# Patient Record
Sex: Male | Born: 1935 | Race: White | Hispanic: No | Marital: Married | Smoking: Former smoker
Health system: Southern US, Community
[De-identification: ages and names within clinical notes are randomized; demographics above are authoritative.]

## PROBLEM LIST (undated history)

## (undated) DIAGNOSIS — R350 Frequency of micturition: Secondary | ICD-10-CM

## (undated) DIAGNOSIS — N471 Phimosis: Secondary | ICD-10-CM

## (undated) DIAGNOSIS — Z973 Presence of spectacles and contact lenses: Secondary | ICD-10-CM

## (undated) DIAGNOSIS — Z8711 Personal history of peptic ulcer disease: Secondary | ICD-10-CM

## (undated) DIAGNOSIS — R3915 Urgency of urination: Secondary | ICD-10-CM

## (undated) DIAGNOSIS — Z972 Presence of dental prosthetic device (complete) (partial): Secondary | ICD-10-CM

## (undated) DIAGNOSIS — I10 Essential (primary) hypertension: Secondary | ICD-10-CM

---

## 1995-02-20 HISTORY — PX: GASTROTOMY: SHX61

## 2009-03-04 ENCOUNTER — Encounter: Admission: RE | Admit: 2009-03-04 | Discharge: 2009-03-04 | Payer: Self-pay | Admitting: Internal Medicine

## 2010-03-12 ENCOUNTER — Encounter: Payer: Self-pay | Admitting: Internal Medicine

## 2013-02-19 HISTORY — PX: CATARACT EXTRACTION W/ INTRAOCULAR LENS  IMPLANT, BILATERAL: SHX1307

## 2014-08-17 DIAGNOSIS — Z0100 Encounter for examination of eyes and vision without abnormal findings: Secondary | ICD-10-CM | POA: Diagnosis not present

## 2014-08-17 DIAGNOSIS — Z961 Presence of intraocular lens: Secondary | ICD-10-CM | POA: Diagnosis not present

## 2016-04-12 DIAGNOSIS — M545 Low back pain: Secondary | ICD-10-CM | POA: Diagnosis not present

## 2016-04-12 DIAGNOSIS — R3911 Hesitancy of micturition: Secondary | ICD-10-CM | POA: Diagnosis not present

## 2016-04-12 DIAGNOSIS — N481 Balanitis: Secondary | ICD-10-CM | POA: Diagnosis not present

## 2016-04-12 DIAGNOSIS — H9193 Unspecified hearing loss, bilateral: Secondary | ICD-10-CM | POA: Diagnosis not present

## 2016-04-12 DIAGNOSIS — Z1389 Encounter for screening for other disorder: Secondary | ICD-10-CM | POA: Diagnosis not present

## 2016-04-12 DIAGNOSIS — R350 Frequency of micturition: Secondary | ICD-10-CM | POA: Diagnosis not present

## 2016-04-12 DIAGNOSIS — Z6826 Body mass index (BMI) 26.0-26.9, adult: Secondary | ICD-10-CM | POA: Diagnosis not present

## 2016-04-13 DIAGNOSIS — R350 Frequency of micturition: Secondary | ICD-10-CM | POA: Diagnosis not present

## 2016-04-13 DIAGNOSIS — R3911 Hesitancy of micturition: Secondary | ICD-10-CM | POA: Diagnosis not present

## 2016-04-13 DIAGNOSIS — Z0000 Encounter for general adult medical examination without abnormal findings: Secondary | ICD-10-CM | POA: Diagnosis not present

## 2016-05-23 DIAGNOSIS — N471 Phimosis: Secondary | ICD-10-CM | POA: Diagnosis not present

## 2016-05-23 DIAGNOSIS — R350 Frequency of micturition: Secondary | ICD-10-CM | POA: Diagnosis not present

## 2016-05-23 DIAGNOSIS — R8279 Other abnormal findings on microbiological examination of urine: Secondary | ICD-10-CM | POA: Diagnosis not present

## 2016-05-24 ENCOUNTER — Other Ambulatory Visit: Payer: Self-pay | Admitting: Urology

## 2016-05-30 ENCOUNTER — Encounter (HOSPITAL_BASED_OUTPATIENT_CLINIC_OR_DEPARTMENT_OTHER): Payer: Self-pay | Admitting: *Deleted

## 2016-05-30 NOTE — Progress Notes (Signed)
NPO AFTER MN.  ARRIVE AT 0600.  NEEDS HG.

## 2016-06-03 NOTE — H&P (Signed)
HPI: Anthony Mclaughlin is a 83 year-old male patient with phimosis  The patient complains of lower urinary tract symptom(s) that include frequency and hesitancy. He has had the symptom(s) for 4 months. His symptoms did begin gradually. He gets up at night to urinate 1 time.   He said that he experiences frequency primarily when he is at home and when he takes a trip he can go for 6 or 7 hours without having to urinate. He does not drink any caffeinated beverages and does not drink an excessive amount of fluids when he is at home. He does not have significant nocturia.     CC: I have trouble rolling my foreskin back.  HPI: He has had difficulties with rolling his foreskin back for 4 months. He was last able to easily roll his foreskin back approximately 02/20/2016. His symptoms have gotten worse over the last year. He does not have diabetes.   He does not have dysuria. He has had inflammation of the head of his penis. He has had to use creams on the lesion.   He reports that for 3-4 months he has had difficulty retracting his foreskin. He also had some irritation and was treated with clotrimazole with resolution of the irritation. He's never had a UTI. He reports he does not have diabetes. His phimotic foreskin has resulted in retention of urine inside the foreskin that causes post void dribbling. This has resulted in his need to wear a pull-up diaper.     ALLERGIES: None   MEDICATIONS: Fish Oil  Multi Vitamin  Vitamin B-12 1,000 mcg tablet, extended release     GU PSH: None   NON-GU PSH: Remove Tonsils And Adenoids    GU PMH: None   NON-GU PMH: Acute gastric ulcer with hemorrhage    FAMILY HISTORY: 2 daughters - Other Alzheimer's Disease - Father, Brother Lung Cancer - Mother   SOCIAL HISTORY: Marital Status: Married Current Smoking Status: Patient has never smoked.   Tobacco Use Assessment Completed: Used Tobacco in last 30 days? Has never drank.  Drinks 3 caffeinated drinks per  day.    REVIEW OF SYSTEMS:    GU Review Male:   Patient reports frequent urination and trouble starting your stream. Patient denies hard to postpone urination, burning/ pain with urination, get up at night to urinate, leakage of urine, stream starts and stops, have to strain to urinate , erection problems, and penile pain.  Gastrointestinal (Upper):   Patient denies nausea, vomiting, and indigestion/ heartburn.  Gastrointestinal (Lower):   Patient denies diarrhea and constipation.  Constitutional:   Patient denies fever, night sweats, weight loss, and fatigue.  Skin:   Patient denies skin rash/ lesion and itching.  Eyes:   Patient denies blurred vision and double vision.  Ears/ Nose/ Throat:   Patient denies sore throat and sinus problems.  Hematologic/Lymphatic:   Patient denies swollen glands and easy bruising.  Cardiovascular:   Patient denies leg swelling and chest pains.  Respiratory:   Patient denies cough and shortness of breath.  Endocrine:   Patient denies excessive thirst.  Musculoskeletal:   Patient reports back pain. Patient denies joint pain.  Neurological:   Patient denies headaches and dizziness.  Psychologic:   Patient denies depression and anxiety.   VITAL SIGNS: None   GU PHYSICAL EXAMINATION:    Anus and Perineum: No hemorrhoids. No anal stenosis. No rectal fissure, no anal fissure. No edema, no dimple, no perineal tenderness, no anal tenderness.  Scrotum: No lesions.  No edema. No cysts. No warts.  Epididymides: Right: no spermatocele, no masses, no cysts, no tenderness, no induration, no enlargement. Left: no spermatocele, no masses, no cysts, no tenderness, no induration, no enlargement.  Testes: No tenderness, no swelling, no enlargement left testes. No tenderness, no swelling, no enlargement right testes. Normal location left testes. Normal location right testes. No mass, no cyst, no varicocele, no hydrocele left testes. No mass, no cyst, no varicocele, no hydrocele  right testes.  Urethral Meatus: Normal size. No lesion, no wart, no discharge, no polyp. Normal location.  Penis: Penis uncircumcised, phimosis. No foreskin warts, no cracks. No dorsal peyronie's plaques, no left corporal peyronie's plaques, no right corporal peyronie's plaques, no scarring, no shaft warts. No balanitis, no meatal stenosis.   Prostate: 40 gram or 2+ size. Left lobe normal consistency, right lobe normal consistency. Symmetrical lobes. No prostate nodule. Left lobe no tenderness, right lobe no tenderness.  Seminal Vesicles: Nonpalpable.  Sphincter Tone: Normal sphincter. No rectal tenderness. No rectal mass.    MULTI-SYSTEM PHYSICAL EXAMINATION:    Constitutional: Well-nourished. No physical deformities. Normally developed. Good grooming.  Neck: Neck symmetrical, not swollen. Normal tracheal position.  Respiratory: No labored breathing, no use of accessory muscles.   Cardiovascular: Normal temperature, normal extremity pulses, no swelling, no varicosities.  Lymphatic: No enlargement of neck, axillae, groin.  Skin: No paleness, no jaundice, no cyanosis. No lesion, no ulcer, no rash.  Neurologic / Psychiatric: Oriented to time, oriented to place, oriented to person. No depression, no anxiety, no agitation.  Gastrointestinal: No mass, no tenderness, no rigidity, non obese abdomen.  Eyes: Normal conjunctivae. Normal eyelids.  Ears, Nose, Mouth, and Throat: Left ear no scars, no lesions, no masses. Right ear no scars, no lesions, no masses. Nose no scars, no lesions, no masses. Normal hearing. Normal lips.  Musculoskeletal: Normal gait and station of head and neck.     PAST DATA REVIEWED:  Source Of History:  Patient  Records Review:   Previous Doctor Records, POC Tool  Notes:                     His PSA on 2/18 was normal at 0.74.   PROCEDURES:           PVR Ultrasound - [id number]  Notes: He is emptying his bladder adequately.  Scanned Volume: 000 cc         Urinalysis  w/Scope Dipstick Dipstick Cont'd Micro  Color: Yellow Bilirubin: Neg WBC/hpf: 10 - 20/hpf  Appearance: Clear Ketones: Neg RBC/hpf: 0 - 2/hpf  Specific Gravity: 1.025 Blood: Neg Bacteria: NS (Not Seen)  pH: 5.5 Protein: Neg Cystals: NS (Not Seen)  Glucose: Neg Urobilinogen: 0.2 Casts: NS (Not Seen)    Nitrites: Neg Trichomonas: Not Present    Leukocyte Esterase: 1+ Mucous: Present      Epithelial Cells: 0 - 5/hpf      Yeast: NS (Not Seen)      Sperm: Not Present    ASSESSMENT/PLAN:     ICD-10 Details  1 GU:   Phimosis - N47.1 He has a severe phimosis to the point where his foreskin cannot be retracted to even visualize the glans and it is causing retention of urine inside the foreskin which then causes dribbling afterwards necessitating the use of a protective undergarment. He has not been diagnosed with diabetes and had no glucose in his urine but I am going to check a BMP and hemoglobin A1c today. We then discussed the  options and because of the difficulty he has been having he wanted to consider pursuing circumcision. I therefore have discussed this procedure with him in detail including how the procedure is performed, its risks and complications, the probability of success, the outpatient nature of the procedure as well as the anticipated postoperative course. He understands and is elected to proceed.  2   Urinary Frequency - R35.0 His urinary frequency is limited to the mornings primarily and are enough of a bothered that he said he would like to try pharmacologic therapy so I'm going to let him take samples of Myrbetriq 25 mg since his PVR is 0 and he will let me know how this has worked for him.  3 NON-GU:   Other abnormal findings on microbiological examination of urine - R82.79 He did have some white cells in his urine today. I think this is almost certainly secondary to contamination because of his inability to retract his foreskin but because I am planning upcoming surgery and he has  having urinary frequency I cultured the urine which was found to be positive and therefore he was treated with appropriate antibiotic therapy.

## 2016-06-04 ENCOUNTER — Ambulatory Visit (HOSPITAL_BASED_OUTPATIENT_CLINIC_OR_DEPARTMENT_OTHER): Payer: Medicare Other | Admitting: Anesthesiology

## 2016-06-04 ENCOUNTER — Encounter (HOSPITAL_BASED_OUTPATIENT_CLINIC_OR_DEPARTMENT_OTHER): Payer: Self-pay | Admitting: *Deleted

## 2016-06-04 ENCOUNTER — Ambulatory Visit (HOSPITAL_BASED_OUTPATIENT_CLINIC_OR_DEPARTMENT_OTHER)
Admission: RE | Admit: 2016-06-04 | Discharge: 2016-06-04 | Disposition: A | Discharge status: Home / Self Care | Payer: Medicare Other | Source: Ambulatory Visit | Attending: Urology | Admitting: Urology

## 2016-06-04 ENCOUNTER — Encounter (HOSPITAL_BASED_OUTPATIENT_CLINIC_OR_DEPARTMENT_OTHER)
Admission: RE | Disposition: A | Discharge status: Home / Self Care | Payer: Self-pay | Source: Ambulatory Visit | Attending: Urology

## 2016-06-04 DIAGNOSIS — Z79899 Other long term (current) drug therapy: Secondary | ICD-10-CM | POA: Diagnosis not present

## 2016-06-04 DIAGNOSIS — Z87891 Personal history of nicotine dependence: Secondary | ICD-10-CM | POA: Insufficient documentation

## 2016-06-04 DIAGNOSIS — Q558 Other specified congenital malformations of male genital organs: Secondary | ICD-10-CM | POA: Insufficient documentation

## 2016-06-04 DIAGNOSIS — N471 Phimosis: Secondary | ICD-10-CM | POA: Diagnosis not present

## 2016-06-04 DIAGNOSIS — Z8711 Personal history of peptic ulcer disease: Secondary | ICD-10-CM | POA: Insufficient documentation

## 2016-06-04 DIAGNOSIS — N3943 Post-void dribbling: Secondary | ICD-10-CM | POA: Insufficient documentation

## 2016-06-04 DIAGNOSIS — N4889 Other specified disorders of penis: Secondary | ICD-10-CM | POA: Diagnosis not present

## 2016-06-04 DIAGNOSIS — IMO0001 Reserved for inherently not codable concepts without codable children: Secondary | ICD-10-CM

## 2016-06-04 HISTORY — DX: Presence of spectacles and contact lenses: Z97.3

## 2016-06-04 HISTORY — DX: Phimosis: N47.1

## 2016-06-04 HISTORY — DX: Frequency of micturition: R35.0

## 2016-06-04 HISTORY — PX: CIRCUMCISION: SHX1350

## 2016-06-04 HISTORY — DX: Urgency of urination: R39.15

## 2016-06-04 HISTORY — DX: Personal history of peptic ulcer disease: Z87.11

## 2016-06-04 HISTORY — DX: Presence of dental prosthetic device (complete) (partial): Z97.2

## 2016-06-04 LAB — POCT I-STAT, CHEM 8
BUN: 12 mg/dL (ref 6–20)
Calcium, Ion: 1.23 mmol/L (ref 1.15–1.40)
Chloride: 102 mmol/L (ref 101–111)
Creatinine, Ser: 1.4 mg/dL — ABNORMAL HIGH (ref 0.61–1.24)
Glucose, Bld: 111 mg/dL — ABNORMAL HIGH (ref 65–99)
HCT: 44 % (ref 39.0–52.0)
Hemoglobin: 15 g/dL (ref 13.0–17.0)
Potassium: 4.6 mmol/L (ref 3.5–5.1)
Sodium: 138 mmol/L (ref 135–145)
TCO2: 26 mmol/L (ref 0–100)

## 2016-06-04 SURGERY — CIRCUMCISION, ADULT
Anesthesia: General

## 2016-06-04 MED ORDER — DEXAMETHASONE SODIUM PHOSPHATE 10 MG/ML IJ SOLN
INTRAMUSCULAR | Status: AC
  Filled 2016-06-04: qty 1

## 2016-06-04 MED ORDER — PROPOFOL 10 MG/ML IV BOLUS
INTRAVENOUS | Status: AC
  Filled 2016-06-04: qty 20

## 2016-06-04 MED ORDER — EPHEDRINE 5 MG/ML INJ
INTRAVENOUS | Status: AC
  Filled 2016-06-04: qty 10

## 2016-06-04 MED ORDER — FENTANYL CITRATE (PF) 100 MCG/2ML IJ SOLN
INTRAMUSCULAR | Status: DC | PRN
  Administered 2016-06-04: 25 ug via INTRAVENOUS
  Administered 2016-06-04: 50 ug via INTRAVENOUS

## 2016-06-04 MED ORDER — FENTANYL CITRATE (PF) 100 MCG/2ML IJ SOLN
25.0000 ug | INTRAMUSCULAR | Status: DC | PRN
  Filled 2016-06-04: qty 1

## 2016-06-04 MED ORDER — PROPOFOL 500 MG/50ML IV EMUL
INTRAVENOUS | Status: DC | PRN
  Administered 2016-06-04: 50 mL via INTRAVENOUS
  Administered 2016-06-04: 150 mL via INTRAVENOUS

## 2016-06-04 MED ORDER — ONDANSETRON HCL 4 MG/2ML IJ SOLN
INTRAMUSCULAR | Status: DC | PRN
  Administered 2016-06-04: 4 mg via INTRAVENOUS

## 2016-06-04 MED ORDER — GLYCOPYRROLATE 0.2 MG/ML IJ SOLN
INTRAMUSCULAR | Status: DC | PRN
  Administered 2016-06-04: 0.2 mg via INTRAVENOUS

## 2016-06-04 MED ORDER — EPHEDRINE SULFATE 50 MG/ML IJ SOLN
INTRAMUSCULAR | Status: DC | PRN
  Administered 2016-06-04: 15 mg via INTRAVENOUS

## 2016-06-04 MED ORDER — CEFAZOLIN SODIUM-DEXTROSE 2-4 GM/100ML-% IV SOLN
2.0000 g | INTRAVENOUS | Status: AC
  Administered 2016-06-04: 2 g via INTRAVENOUS
  Filled 2016-06-04: qty 100

## 2016-06-04 MED ORDER — BUPIVACAINE HCL 0.5 % IJ SOLN
INTRAMUSCULAR | Status: DC | PRN
  Administered 2016-06-04: 10 mL
  Administered 2016-06-04: 8 mL

## 2016-06-04 MED ORDER — CEFAZOLIN SODIUM-DEXTROSE 2-4 GM/100ML-% IV SOLN
INTRAVENOUS | Status: AC
  Filled 2016-06-04: qty 100

## 2016-06-04 MED ORDER — FENTANYL CITRATE (PF) 100 MCG/2ML IJ SOLN
INTRAMUSCULAR | Status: AC
  Filled 2016-06-04: qty 2

## 2016-06-04 MED ORDER — ARTIFICIAL TEARS OP OINT
TOPICAL_OINTMENT | OPHTHALMIC | Status: AC
  Filled 2016-06-04: qty 3.5

## 2016-06-04 MED ORDER — ONDANSETRON HCL 4 MG/2ML IJ SOLN
INTRAMUSCULAR | Status: AC
  Filled 2016-06-04: qty 2

## 2016-06-04 MED ORDER — HYDROCODONE-ACETAMINOPHEN 10-325 MG PO TABS: 1.0000 | ORAL_TABLET | ORAL | 0 refills | Status: DC | PRN

## 2016-06-04 MED ORDER — LIDOCAINE 2% (20 MG/ML) 5 ML SYRINGE
INTRAMUSCULAR | Status: DC | PRN
  Administered 2016-06-04: 100 mg via INTRAVENOUS

## 2016-06-04 MED ORDER — PHENYLEPHRINE 40 MCG/ML (10ML) SYRINGE FOR IV PUSH (FOR BLOOD PRESSURE SUPPORT)
PREFILLED_SYRINGE | INTRAVENOUS | Status: AC
  Filled 2016-06-04: qty 10

## 2016-06-04 MED ORDER — LACTATED RINGERS IV SOLN
INTRAVENOUS | Status: DC
  Administered 2016-06-04: 07:00:00 via INTRAVENOUS
  Filled 2016-06-04: qty 1000

## 2016-06-04 MED ORDER — LIDOCAINE 2% (20 MG/ML) 5 ML SYRINGE
INTRAMUSCULAR | Status: AC
  Filled 2016-06-04: qty 5

## 2016-06-04 MED ORDER — ONDANSETRON HCL 4 MG/2ML IJ SOLN
4.0000 mg | Freq: Once | INTRAMUSCULAR | Status: DC | PRN
  Filled 2016-06-04: qty 2

## 2016-06-04 MED ORDER — DEXAMETHASONE SODIUM PHOSPHATE 10 MG/ML IJ SOLN
INTRAMUSCULAR | Status: DC | PRN
  Administered 2016-06-04: 10 mg via INTRAVENOUS

## 2016-06-04 MED ORDER — BACITRACIN-NEOMYCIN-POLYMYXIN 400-5-5000 EX OINT
TOPICAL_OINTMENT | CUTANEOUS | Status: DC | PRN
  Administered 2016-06-04: 1 via TOPICAL

## 2016-06-04 SURGICAL SUPPLY — 36 items
BANDAGE CO FLEX L/F 1IN X 5YD (GAUZE/BANDAGES/DRESSINGS) ×3 IMPLANT
BANDAGE CO FLEX L/F 2IN X 5YD (GAUZE/BANDAGES/DRESSINGS) IMPLANT
BLADE CLIPPER SURG (BLADE) IMPLANT
BLADE SURG 15 STRL LF DISP TIS (BLADE) ×1 IMPLANT
BLADE SURG 15 STRL SS (BLADE) ×2
BNDG CONFORM 2 STRL LF (GAUZE/BANDAGES/DRESSINGS) ×3 IMPLANT
CLEANER CAUTERY TIP 5X5 PAD (MISCELLANEOUS) ×1 IMPLANT
COVER BACK TABLE 60X90IN (DRAPES) ×3 IMPLANT
COVER MAYO STAND STRL (DRAPES) ×3 IMPLANT
DRAIN PENROSE 18X1/4 LTX STRL (WOUND CARE) IMPLANT
DRAPE LAPAROTOMY 100X72 PEDS (DRAPES) ×3 IMPLANT
ELECT REM PT RETURN 9FT ADLT (ELECTROSURGICAL) ×3
ELECTRODE REM PT RTRN 9FT ADLT (ELECTROSURGICAL) ×1 IMPLANT
GLOVE BIO SURGEON STRL SZ8 (GLOVE) ×3 IMPLANT
GOWN STRL REUS W/ TWL LRG LVL3 (GOWN DISPOSABLE) ×1 IMPLANT
GOWN STRL REUS W/ TWL XL LVL3 (GOWN DISPOSABLE) ×1 IMPLANT
GOWN STRL REUS W/TWL LRG LVL3 (GOWN DISPOSABLE) ×2
GOWN STRL REUS W/TWL XL LVL3 (GOWN DISPOSABLE) ×2
IV NS IRRIG 3000ML ARTHROMATIC (IV SOLUTION) IMPLANT
KIT RM TURNOVER CYSTO AR (KITS) ×3 IMPLANT
MANIFOLD NEPTUNE II (INSTRUMENTS) IMPLANT
NEEDLE HYPO 22GX1.5 SAFETY (NEEDLE) ×3 IMPLANT
NS IRRIG 500ML POUR BTL (IV SOLUTION) IMPLANT
PACK BASIN DAY SURGERY FS (CUSTOM PROCEDURE TRAY) ×3 IMPLANT
PAD CLEANER CAUTERY TIP 5X5 (MISCELLANEOUS) ×2
PENCIL BUTTON HOLSTER BLD 10FT (ELECTRODE) ×3 IMPLANT
SUT CHROMIC 3 0 SH 27 (SUTURE) ×6 IMPLANT
SUT CHROMIC 4 0 RB 1X27 (SUTURE) IMPLANT
SUT CHROMIC 5 0 RB 1 27 (SUTURE) ×3 IMPLANT
SYR CONTROL 10ML LL (SYRINGE) ×3 IMPLANT
TOWEL OR 17X24 6PK STRL BLUE (TOWEL DISPOSABLE) ×6 IMPLANT
TRAY DSU PREP LF (CUSTOM PROCEDURE TRAY) ×3 IMPLANT
TUBE CONNECTING 12'X1/4 (SUCTIONS)
TUBE CONNECTING 12X1/4 (SUCTIONS) IMPLANT
WATER STERILE IRR 3000ML UROMA (IV SOLUTION) IMPLANT
WATER STERILE IRR 500ML POUR (IV SOLUTION) ×3 IMPLANT

## 2016-06-04 NOTE — Transfer of Care (Signed)
Immediate Anesthesia Transfer of Care Note  Patient: Wynelle Bourgeois  Procedure(s) Performed: Procedure(s): CIRCUMCISION ADULT (N/A)  Patient Location: PACU  Anesthesia Type:General  Level of Consciousness: awake, alert , oriented and patient cooperative  Airway & Oxygen Therapy: Patient Spontanous Breathing and Patient connected to nasal cannula oxygen  Post-op Assessment: Report given to RN and Post -op Vital signs reviewed and stable  Post vital signs: Reviewed and stable  Last Vitals:  Vitals:   06/04/16 0607  BP: (!) 159/92  Pulse: 99  Resp: 16  Temp: 37.1 C    Last Pain:  Vitals:   06/04/16 0607  TempSrc: Oral      Patients Stated Pain Goal: 5 (83/38/25 0539)  Complications: No apparent anesthesia complications

## 2016-06-04 NOTE — Anesthesia Preprocedure Evaluation (Addendum)
Anesthesia Evaluation  Patient identified by MRN, date of birth, ID band Patient awake    Reviewed: Allergy & Precautions, NPO status , Patient's Chart, lab work & pertinent test results  Airway Mallampati: II  TM Distance: <3 FB Neck ROM: Full    Dental  (+) Dental Advisory Given, Partial Upper   Pulmonary former smoker,    Pulmonary exam normal breath sounds clear to auscultation       Cardiovascular negative cardio ROS Normal cardiovascular exam Rhythm:Regular Rate:Normal     Neuro/Psych negative neurological ROS  negative psych ROS   GI/Hepatic Neg liver ROS, H/o bleeding peptic ulcer s/p gastrotomy   Endo/Other  negative endocrine ROS  Renal/GU negative Renal ROS   Phimosis     Musculoskeletal negative musculoskeletal ROS (+)   Abdominal   Peds  Hematology negative hematology ROS (+)   Anesthesia Other Findings Day of surgery medications reviewed with the patient.  Reproductive/Obstetrics                            Anesthesia Physical Anesthesia Plan  ASA: III  Anesthesia Plan: General   Post-op Pain Management:    Induction: Intravenous  Airway Management Planned: LMA  Additional Equipment:   Intra-op Plan:   Post-operative Plan: Extubation in OR  Informed Consent: I have reviewed the patients History and Physical, chart, labs and discussed the procedure including the risks, benefits and alternatives for the proposed anesthesia with the patient or authorized representative who has indicated his/her understanding and acceptance.   Dental advisory given  Plan Discussed with: CRNA  Anesthesia Plan Comments: (Risks/benefits of general anesthesia discussed with patient including risk of damage to teeth, lips, gum, and tongue, nausea/vomiting, allergic reactions to medications, and the possibility of heart attack, stroke and death.  All patient questions answered.   Patient wishes to proceed.)        Anesthesia Quick Evaluation

## 2016-06-04 NOTE — Anesthesia Postprocedure Evaluation (Signed)
Anesthesia Post Note  Patient: Wynelle Bourgeois  Procedure(s) Performed: Procedure(s) (LRB): CIRCUMCISION ADULT (N/A)  Patient location during evaluation: PACU Anesthesia Type: General Level of consciousness: awake and alert Pain management: pain level controlled Vital Signs Assessment: post-procedure vital signs reviewed and stable Respiratory status: spontaneous breathing, nonlabored ventilation, respiratory function stable and patient connected to nasal cannula oxygen Cardiovascular status: blood pressure returned to baseline and stable Postop Assessment: no signs of nausea or vomiting Anesthetic complications: no       Last Vitals:  Vitals:   06/04/16 0852 06/04/16 0900  BP:    Pulse: 96 96  Resp: 15 15  Temp:      Last Pain:  Vitals:   06/04/16 0821  TempSrc:   PainSc: Asleep                 Catalina Gravel

## 2016-06-04 NOTE — Op Note (Signed)
PATIENT:  Anthony Mclaughlin  PRE-OPERATIVE DIAGNOSIS: Phimosis  POST-OPERATIVE DIAGNOSIS: 1. Phimosis 2. Mild glanular adhesions  PROCEDURE: 1. Circumcision 2. Partial release of glandular adhesions  SURGEON:  Claybon Jabs  INDICATION: Anthony Mclaughlin is a 83 year old male who has developed progressive inability to retract his foreskin. Random serum glucose and hemoglobin A1c were not suggestive of diabetes. He has become symptomatic and when he urinates the urine is tract inside the foreskin resulting in post void dribbling. He is brought to the operating room for circumcision.  ANESTHESIA:  General  EBL:  Minimal  DRAINS: None  LOCAL MEDICATIONS USED:  None  SPECIMEN:  None  Description of procedure: After informed consent the patient was taken to the operating room and placed on the table in a supine position. General anesthesia was then administered. Once fully anesthetized the genitalia were sterilely prepped and draped in standard fashion. An official timeout was then performed.  I first performed a dorsal penile block in standard fashion using 1/2% Marcaine. I also performed a ring block at the base of the penis. Attempt at retracting the foreskin was unsuccessful. I placed a hemostat in the opening of the foreskin and was able to stretch this slightly but still unable to retract the foreskin and therefore I performed a dorsal slit.   I used a straight hemostat and placed it through the opening in his distal foreskin at the 12:00 position. This was clamped and then released and I used the Metzenbaum scissors to incise to perform a dorsal slit. This allowed me to retract the foreskin and I noted the glans and meatus appeared relatively normal but there were glanular adhesions which were released to some degree although there were some circumferential adhesions at the very proximal corona that appeared very dense and I did not attempt to release these as they were quite minimal.  I then  made a circumcising incision about 6 mm from the corona circumferentially. I then replaced the foreskin in its normal anatomic position and marked a corresponding location on the shaft skin. The shaft skin was then incised circumferentially and I used sharp technique to excise the redundant foreskin. The Bovie electrocautery unit was used to cauterize bleeding points and I then placed a 3-0 chromic used stitch at the 6:00 position and a second 3-0 chromic at the 12:00 position. These were then used to reapproximate the shaft skin edges circumferentially in a running fashion.  I then applied Neosporin to the incision and loosely wrapped 2 4 x 4 gauze sponges around the penis and gently wrapped it to secure the gauze in place. The patient was then awakened and taken to recovery room in stable and satisfactory condition. He tolerated procedure well and there were no intraoperative complications. Needle, sponge and instrument counts were correct 2 at the end of the operation.   PLAN OF CARE: Discharge to home after PACU  PATIENT DISPOSITION:  PACU - hemodynamically stable.

## 2016-06-04 NOTE — Discharge Instructions (Signed)
Postoperative instructions for circumcision ° °Wound: ° °In most cases your incision will have absorbable sutures that run along the course of your incision and will dissolve within the first 10-20 days. Some will fall out even earlier. Expect some redness as the sutures dissolved but this should occur only around the sutures. If there is generalized redness, especially with increasing pain or swelling, let us know. The penis will very likely get "black and blue" as the blood in the tissues spread. Sometimes the whole penis will turn colors. The black and blue is followed by a yellow and brown color. In time, all the discoloration will go away. ° °Diet: ° °You may return to your normal diet within 24 hours following your surgery. You may note some mild nausea and possibly vomiting the first 6-8 hours following surgery. This is usually due to the side effects of anesthesia, and will disappear quite soon. I would suggest clear liquids and a very light meal the first evening following your surgery. ° °Activity: ° °Your physical activity should be restricted the first 48 hours. During that time you should remain relatively inactive, moving about only when necessary. During the first 7-10 days following surgery he should avoid lifting any heavy objects (anything greater than 15 pounds), and avoid strenuous exercise. If you work, ask us specifically about your restrictions, both for work and home. We will write a note to your employer if needed. ° °Ice packs can be placed on and off over the penis for the first 48 hours to help relieve the pain and keep the swelling down. Frozen peas or corn in a ZipLock bag can be frozen, used and re-frozen. Fifteen minutes on and 15 minutes off is a reasonable schedule.  ° °Hygiene: ° °You may shower 48 hours after your surgery. Tub bathing should be restricted until the seventh day. ° °Medication: ° °You will be sent home with some type of pain medication. In many cases you will be  sent home with a narcotic pain pill (Vicodin or Tylox). If the pain is not too bad, you may take either Tylenol (acetaminophen) or Advil (ibuprofen) which contain no narcotic agents, and might be tolerated a little better, with fewer side effects. If the pain medication you are sent home with does not control the pain, you will have to let us know. Some narcotic pain medications cannot be given or refilled by a phone call to a pharmacy. ° °Problems you should report to us: ° °· Fever of 101.0 degrees Fahrenheit or greater. °· Moderate or severe swelling under the skin incision or involving the scrotum. °· Drug reaction such as hives, a rash, nausea or vomiting.  ° ° ° °Post Anesthesia Home Care Instructions ° °Activity: °Get plenty of rest for the remainder of the day. A responsible individual must stay with you for 24 hours following the procedure.  °For the next 24 hours, DO NOT: °-Drive a car °-Operate machinery °-Drink alcoholic beverages °-Take any medication unless instructed by your physician °-Make any legal decisions or sign important papers. ° °Meals: °Start with liquid foods such as gelatin or soup. Progress to regular foods as tolerated. Avoid greasy, spicy, heavy foods. If nausea and/or vomiting occur, drink only clear liquids until the nausea and/or vomiting subsides. Call your physician if vomiting continues. ° °Special Instructions/Symptoms: °Your throat may feel dry or sore from the anesthesia or the breathing tube placed in your throat during surgery. If this causes discomfort, gargle with warm salt water. The   discomfort should disappear within 24 hours. ° °If you had a scopolamine patch placed behind your ear for the management of post- operative nausea and/or vomiting: ° °1. The medication in the patch is effective for 72 hours, after which it should be removed.  Wrap patch in a tissue and discard in the trash. Wash hands thoroughly with soap and water. °2. You may remove the patch earlier than  72 hours if you experience unpleasant side effects which may include dry mouth, dizziness or visual disturbances. °3. Avoid touching the patch. Wash your hands with soap and water after contact with the patch. °  ° ° ° ° ° ° ° ° ° ° ° ° ° ° ° °  ° °

## 2016-06-04 NOTE — Anesthesia Procedure Notes (Signed)
Procedure Name: LMA Insertion Date/Time: 06/04/2016 7:36 AM Performed by: Wanita Chamberlain Pre-anesthesia Checklist: Patient identified, Timeout performed, Emergency Drugs available, Suction available and Patient being monitored Patient Re-evaluated:Patient Re-evaluated prior to inductionOxygen Delivery Method: Circle system utilized Preoxygenation: Pre-oxygenation with 100% oxygen Intubation Type: IV induction Ventilation: Mask ventilation without difficulty LMA: LMA inserted LMA Size: 5.0 Number of attempts: 1 Placement Confirmation: positive ETCO2 and breath sounds checked- equal and bilateral Tube secured with: Tape

## 2016-06-04 NOTE — Anesthesia Preprocedure Evaluation (Addendum)
Anesthesia Evaluation  Patient identified by MRN, date of birth, ID band Patient awake    Airway Mallampati: II  TM Distance: <3 FB Neck ROM: Limited    Dental  (+) Dental Advisory Given, Partial Upper,    Pulmonary neg pulmonary ROS, former smoker,    Pulmonary exam normal breath sounds clear to auscultation       Cardiovascular Exercise Tolerance: Good negative cardio ROS Normal cardiovascular exam Rhythm:Regular Rate:Normal     Neuro/Psych negative psych ROS   GI/Hepatic Neg liver ROS,   Endo/Other  negative endocrine ROS  Renal/GU negative Renal ROS   phimosis    Musculoskeletal  (+) Arthritis , Osteoarthritis,    Abdominal   Peds negative pediatric ROS (+)  Hematology negative hematology ROS (+)   Anesthesia Other Findings   Reproductive/Obstetrics negative OB ROS                          Anesthesia Physical Anesthesia Plan  ASA: II  Anesthesia Plan: General   Post-op Pain Management:    Induction: Intravenous  Airway Management Planned: LMA  Additional Equipment:   Intra-op Plan:   Post-operative Plan: Extubation in OR  Informed Consent: I have reviewed the patients History and Physical, chart, labs and discussed the procedure including the risks, benefits and alternatives for the proposed anesthesia with the patient or authorized representative who has indicated his/her understanding and acceptance.   Dental advisory given  Plan Discussed with: CRNA and Anesthesiologist  Anesthesia Plan Comments:         Anesthesia Quick Evaluation

## 2016-06-05 ENCOUNTER — Encounter (HOSPITAL_BASED_OUTPATIENT_CLINIC_OR_DEPARTMENT_OTHER): Payer: Self-pay | Admitting: Urology

## 2016-06-18 DIAGNOSIS — R350 Frequency of micturition: Secondary | ICD-10-CM | POA: Diagnosis not present

## 2016-09-14 DIAGNOSIS — R350 Frequency of micturition: Secondary | ICD-10-CM | POA: Diagnosis not present

## 2016-09-14 DIAGNOSIS — N471 Phimosis: Secondary | ICD-10-CM | POA: Diagnosis not present

## 2017-02-15 DIAGNOSIS — Z961 Presence of intraocular lens: Secondary | ICD-10-CM | POA: Diagnosis not present

## 2017-02-15 DIAGNOSIS — H524 Presbyopia: Secondary | ICD-10-CM | POA: Diagnosis not present

## 2017-04-08 DIAGNOSIS — Z125 Encounter for screening for malignant neoplasm of prostate: Secondary | ICD-10-CM | POA: Diagnosis not present

## 2017-04-08 DIAGNOSIS — Z79899 Other long term (current) drug therapy: Secondary | ICD-10-CM | POA: Diagnosis not present

## 2017-04-08 DIAGNOSIS — R82998 Other abnormal findings in urine: Secondary | ICD-10-CM | POA: Diagnosis not present

## 2017-04-15 DIAGNOSIS — E7849 Other hyperlipidemia: Secondary | ICD-10-CM | POA: Diagnosis not present

## 2017-04-15 DIAGNOSIS — R3911 Hesitancy of micturition: Secondary | ICD-10-CM | POA: Diagnosis not present

## 2017-04-15 DIAGNOSIS — H9193 Unspecified hearing loss, bilateral: Secondary | ICD-10-CM | POA: Diagnosis not present

## 2017-04-15 DIAGNOSIS — Z0000 Encounter for general adult medical examination without abnormal findings: Secondary | ICD-10-CM | POA: Diagnosis not present

## 2017-04-15 DIAGNOSIS — M545 Low back pain: Secondary | ICD-10-CM | POA: Diagnosis not present

## 2017-04-15 DIAGNOSIS — Z1389 Encounter for screening for other disorder: Secondary | ICD-10-CM | POA: Diagnosis not present

## 2017-04-15 DIAGNOSIS — Z6827 Body mass index (BMI) 27.0-27.9, adult: Secondary | ICD-10-CM | POA: Diagnosis not present

## 2017-04-18 DIAGNOSIS — Z1212 Encounter for screening for malignant neoplasm of rectum: Secondary | ICD-10-CM | POA: Diagnosis not present

## 2017-11-02 DIAGNOSIS — X58XXXA Exposure to other specified factors, initial encounter: Secondary | ICD-10-CM | POA: Diagnosis not present

## 2017-11-02 DIAGNOSIS — Z87891 Personal history of nicotine dependence: Secondary | ICD-10-CM | POA: Diagnosis not present

## 2017-11-02 DIAGNOSIS — T18128A Food in esophagus causing other injury, initial encounter: Secondary | ICD-10-CM | POA: Diagnosis not present

## 2017-11-02 DIAGNOSIS — Z980 Intestinal bypass and anastomosis status: Secondary | ICD-10-CM | POA: Diagnosis not present

## 2017-11-02 DIAGNOSIS — Y9389 Activity, other specified: Secondary | ICD-10-CM | POA: Insufficient documentation

## 2017-11-02 DIAGNOSIS — Z79899 Other long term (current) drug therapy: Secondary | ICD-10-CM | POA: Diagnosis not present

## 2017-11-02 DIAGNOSIS — Y998 Other external cause status: Secondary | ICD-10-CM | POA: Diagnosis not present

## 2017-11-02 DIAGNOSIS — T17228A Food in pharynx causing other injury, initial encounter: Secondary | ICD-10-CM | POA: Diagnosis present

## 2017-11-02 DIAGNOSIS — Y929 Unspecified place or not applicable: Secondary | ICD-10-CM | POA: Insufficient documentation

## 2017-11-02 DIAGNOSIS — K222 Esophageal obstruction: Secondary | ICD-10-CM | POA: Diagnosis not present

## 2017-11-02 DIAGNOSIS — Q396 Congenital diverticulum of esophagus: Secondary | ICD-10-CM | POA: Diagnosis not present

## 2017-11-03 ENCOUNTER — Emergency Department (HOSPITAL_COMMUNITY)
Admission: EM | Admit: 2017-11-03 | Discharge: 2017-11-03 | Disposition: A | Discharge status: Home / Self Care | Payer: Medicare Other | Attending: Emergency Medicine | Admitting: Emergency Medicine

## 2017-11-03 ENCOUNTER — Encounter (HOSPITAL_COMMUNITY): Payer: Self-pay | Admitting: *Deleted

## 2017-11-03 ENCOUNTER — Encounter (HOSPITAL_COMMUNITY)
Admission: EM | Disposition: A | Discharge status: Home / Self Care | Payer: Self-pay | Source: Home / Self Care | Attending: Emergency Medicine

## 2017-11-03 ENCOUNTER — Other Ambulatory Visit: Payer: Self-pay

## 2017-11-03 DIAGNOSIS — Q396 Congenital diverticulum of esophagus: Secondary | ICD-10-CM

## 2017-11-03 DIAGNOSIS — R1319 Other dysphagia: Secondary | ICD-10-CM | POA: Insufficient documentation

## 2017-11-03 DIAGNOSIS — R1310 Dysphagia, unspecified: Secondary | ICD-10-CM | POA: Insufficient documentation

## 2017-11-03 DIAGNOSIS — Z980 Intestinal bypass and anastomosis status: Secondary | ICD-10-CM

## 2017-11-03 DIAGNOSIS — T18128A Food in esophagus causing other injury, initial encounter: Secondary | ICD-10-CM

## 2017-11-03 DIAGNOSIS — K222 Esophageal obstruction: Secondary | ICD-10-CM

## 2017-11-03 HISTORY — PX: ESOPHAGOGASTRODUODENOSCOPY: SHX5428

## 2017-11-03 LAB — I-STAT CHEM 8, ED
BUN: 16 mg/dL (ref 8–23)
CALCIUM ION: 1.05 mmol/L — AB (ref 1.15–1.40)
CHLORIDE: 110 mmol/L (ref 98–111)
Creatinine, Ser: 0.9 mg/dL (ref 0.61–1.24)
GLUCOSE: 107 mg/dL — AB (ref 70–99)
HCT: 43 % (ref 39.0–52.0)
Hemoglobin: 14.6 g/dL (ref 13.0–17.0)
POTASSIUM: 3.8 mmol/L (ref 3.5–5.1)
Sodium: 140 mmol/L (ref 135–145)
TCO2: 22 mmol/L (ref 22–32)

## 2017-11-03 SURGERY — EGD (ESOPHAGOGASTRODUODENOSCOPY)
Anesthesia: Moderate Sedation | Laterality: Left

## 2017-11-03 MED ORDER — BUTAMBEN-TETRACAINE-BENZOCAINE 2-2-14 % EX AERO
INHALATION_SPRAY | CUTANEOUS | Status: DC | PRN
  Administered 2017-11-03: 2 via TOPICAL

## 2017-11-03 MED ORDER — SUCRALFATE 1 GM/10ML PO SUSP: 1.0000 g | Freq: Three times a day (TID) | ORAL | 0 refills | Status: DC

## 2017-11-03 MED ORDER — SODIUM CHLORIDE 0.9 % IV BOLUS
1000.0000 mL | Freq: Once | INTRAVENOUS | Status: AC
Start: 2017-11-03 — End: 2017-11-03
  Administered 2017-11-03: 1000 mL via INTRAVENOUS

## 2017-11-03 MED ORDER — FENTANYL CITRATE (PF) 100 MCG/2ML IJ SOLN
INTRAMUSCULAR | Status: AC
  Filled 2017-11-03: qty 2

## 2017-11-03 MED ORDER — PANTOPRAZOLE SODIUM 40 MG PO TBEC: 40.0000 mg | DELAYED_RELEASE_TABLET | Freq: Two times a day (BID) | ORAL | 1 refills | Status: DC

## 2017-11-03 MED ORDER — FENTANYL CITRATE (PF) 100 MCG/2ML IJ SOLN
INTRAMUSCULAR | Status: DC | PRN
  Administered 2017-11-03 (×2): 25 ug via INTRAVENOUS

## 2017-11-03 MED ORDER — MIDAZOLAM HCL 5 MG/ML IJ SOLN
INTRAMUSCULAR | Status: AC
  Filled 2017-11-03: qty 2

## 2017-11-03 MED ORDER — MIDAZOLAM HCL 10 MG/2ML IJ SOLN
INTRAMUSCULAR | Status: DC | PRN
  Administered 2017-11-03: 2 mg via INTRAVENOUS
  Administered 2017-11-03: 1 mg via INTRAVENOUS
  Administered 2017-11-03: 2 mg via INTRAVENOUS

## 2017-11-03 MED ORDER — SODIUM CHLORIDE 0.9 % IV SOLN: INTRAVENOUS | Status: DC

## 2017-11-03 NOTE — ED Notes (Signed)
Bed: YB01 Expected date:  Expected time:  Means of arrival:  Comments: EGD RES A

## 2017-11-03 NOTE — ED Notes (Signed)
Bed: RESA Expected date:  Expected time:  Means of arrival:  Comments: EGD for 18

## 2017-11-03 NOTE — Op Note (Signed)
[facility] Patient Name: Anthony Mclaughlin Procedure Date: 11/03/2017 MRN: [id number] Attending MD: Gerrit Heck , MD Date of Birth: May 14, 1933 CSN: [id number] Age: 84 Admit Type: Emergency Department Procedure:                Upper GI endoscopy Indications:              Dysphagia, Foreign body in the esophagus Providers:                Gerrit Heck, MD, Zenon Mayo, RN, Cletis Athens,                            Technician Referring MD:             Rolland Porter, MD (Emergency Department) Medicines:                Midazolam 5 mg IV, Fentanyl 50 micrograms IV,                            Benzocaine spray Complications:            No immediate complications. Estimated Blood Loss:     Estimated blood loss: none. Procedure:                Pre-Anesthesia Assessment:                           - Prior to the procedure, a History and Physical                            was performed, and patient medications and                            allergies were reviewed. The patient's tolerance of                            previous anesthesia was also reviewed. The risks                            and benefits of the procedure and the sedation                            options and risks were discussed with the patient.                            All questions were answered, and informed consent                            was obtained. Prior Anticoagulants: The patient has                            taken no previous anticoagulant or antiplatelet                            agents. ASA Grade Assessment: II - A patient with  mild systemic disease. After reviewing the risks                            and benefits, the patient was deemed in                            satisfactory condition to undergo the procedure.                           After obtaining informed consent, the endoscope was                            passed under direct vision. Throughout the                        procedure, the patient's blood pressure, pulse, and                            oxygen saturations were monitored continuously. The                            GIF-H190 ([id number]) Olympus adult endoscope was                            introduced through the mouth, and advanced to the                            second part of duodenum. The upper GI endoscopy was                            accomplished without difficulty. The patient                            tolerated the procedure well. Scope In: Scope Out: Findings:      Food was found in the lower third of the esophagus. This was advanced       into the stomach using air insufflation and gentle pressure with the       endoscope. The mucosa was inspected after removal of the food bolus and       notable for mucosal irritation and erythema.      Two benign-appearing, intrinsic moderate stenoses were found in the       lower esophagus. The narrowest stenosis measured 1.1 cm (inner diameter)       x 1 cm (in length) and was located at the gastroesophageal junction. The       stenoses were traversed. The less pronounced stenosis was located at 35       cm from the incisors and positioned proximal to the food impaction.      A non-bleeding diverticulum with a large opening and no stigmata of       recent bleeding was found in the lower third of the esophagus.      Altered anatomy and evidence of a previous surgery was found in the       gastric fundus and in the gastric body. This corresponds to his history       of gastric surgery  for PUD in 1990's.      The mucosa was otherwise normal appearing throughout the entire examined       stomach.      The duodenal bulb, first portion of the duodenum and second portion of       the duodenum were normal. Impression:               - Food was found in the esophagus. This was                            advanced into the stomach using air insufflation                            and  gentle pressure with the endoscope.                           - Benign-appearing esophageal stenoses.                           - Diverticulum in the lower third of the esophagus.                           - Altered gastric anatomy with evidence of previous                            surgery noted, consistent with history of PUD                            requiring surgical intervention in the 1990's.                           - Normal mucosa was found in the entire stomach.                           - Normal duodenal bulb, first portion of the                            duodenum and second portion of the duodenum.                           - No specimens collected. Moderate Sedation:      Moderate (conscious) sedation was administered by the endoscopy nurse       and supervised by the endoscopist. The following parameters were       monitored: oxygen saturation, heart rate, blood pressure, and response       to care. Recommendation:           - Patient has a contact number available for                            emergencies. The signs and symptoms of potential                            delayed complications were discussed with the  patient. Return to normal activities tomorrow.                            Written discharge instructions were provided to the                            patient.                           - Full liquid diet for 2 days. Then, advance to                            soft foods/blended consistenct foods until                            follow-up. Continue to chew food thoroughly and                            with plenty of fluids.                           - Continue present medications.                           - Use Protonix (pantoprazole) 40 mg PO BID for 8                            weeks.                           - Use sucralfate suspension 1 gram PO QID for 2                            weeks.                           - Repeat  upper endoscopy in 6-8 weeks to check                            healing and for retreatment/esophageal dilation as                            indicated.                           - Follow-up with Dr. Bryan Lemma in the [facility]                            cl[facility] at appointment to be scheduled. Procedure Code(s):        --- Professional ---                           [id number], Esophagogastroduodenoscopy, flexible,                            transoral; diagnostic, including collection of  specimen(s) by brushing or washing, when performed                            (separate procedure) Diagnosis Code(s):        --- Professional ---                           X79.390Z, Food in esophagus causing other injury,                            initial encounter                           K22.2, Esophageal obstruction                           Q39.6, Congenital diverticulum of esophagus                           Z98.0, Intestinal bypass and anastomosis status                           R13.10, Dysphagia, unspecified                           T18.108A, Unspecified foreign body in esophagus                            causing other injury, initial encounter CPT copyright 2017 [employer]. All rights reserved. The codes documented in this report are preliminary and upon coder review may  be revised to meet current compliance requirements. Gerrit Heck, MD 11/03/2017 6:40:00 AM Number of Addenda: 0

## 2017-11-03 NOTE — ED Notes (Signed)
He is awake, alert and comfortable. He tells me he feels much better.

## 2017-11-03 NOTE — Progress Notes (Signed)
Consultation  Referring Provider:     Emergency Department Primary Care Physician:  Leanna Battles, MD Primary Gastroenterologist:      None   Reason for Consultation:     Food impaction, dysphagia         HPI:   Anthony Mclaughlin is a 84 y.o. male presenting to the ER with acute food impaction following ingestion of ribs yesterday. He has been unable to tolerate fluids and now his own secretions. He has a history of intermittent dysphagia to solids, but no prior food impaction. His only EGD was a few decades ago for PUD. He denies any hx of GERD. No recent overt GI blood loss. Not on any systemic anticoagulation. No allergies and no issues with sedation in past.  Past Medical History:  Diagnosis Date  . Frequency of urination   . History of bleeding peptic ulcer    1997  s/p  surgery  . Phimosis   . Urgency of urination   . Wears glasses   . Wears partial dentures    upper    Past Surgical History:  Procedure Laterality Date  . CATARACT EXTRACTION W/ INTRAOCULAR LENS  IMPLANT, BILATERAL  2015  . CIRCUMCISION N/A 06/04/2016   Procedure: CIRCUMCISION ADULT;  Surgeon: Kathie Rhodes, MD;  Location: [facility];  Service: Urology;  Laterality: N/A;  . GASTROTOMY  1997   bleeding peptic ulcer    History reviewed. No pertinent family history.   Social History   Tobacco Use  . Smoking status: Former Smoker    Years: 1.00    Types: Cigarettes    Last attempt to quit: 05/30/1972    Years since quitting: 45.4  . Smokeless tobacco: Never Used  Substance Use Topics  . Alcohol use: No  . Drug use: No    Prior to Admission medications   Medication Sig Start Date End Date Taking? Authorizing Provider  Cyanocobalamin (B-12) 1000 MCG CAPS Take 1 tablet by mouth daily.   Yes [provider]  Multiple Vitamin (MULTIVITAMIN) tablet Take 1 tablet by mouth daily.   Yes [provider]  Omega-3 Fatty Acids (FISH OIL) 1000 MG CAPS Take 1 capsule by mouth  daily.   Yes [provider]    Current Facility-Administered Medications  Medication Dose Route Frequency Provider Last Rate Last Dose  . 0.9 %  sodium chloride infusion   Intravenous Continuous Arik Husmann V, DO      . butamben-tetracaine-benzocaine (CETACAINE) spray    PRN Samer Dutton V, DO   2 spray at 11/03/17 0556  . fentaNYL (SUBLIMAZE) injection    PRN Lynsey Ange V, DO   25 mcg at 11/03/17 0601   Current Outpatient Medications  Medication Sig Dispense Refill  . Cyanocobalamin (B-12) 1000 MCG CAPS Take 1 tablet by mouth daily.    . Multiple Vitamin (MULTIVITAMIN) tablet Take 1 tablet by mouth daily.    . Omega-3 Fatty Acids (FISH OIL) 1000 MG CAPS Take 1 capsule by mouth daily.      Allergies as of 11/02/2017  . (No Known Allergies)     Review of Systems:    As per HPI, otherwise negative    Physical Exam:  Vital signs in last 24 hours: Temp:  [97.7 F (36.5 C)-98 F (36.7 C)] 98 F (36.7 C) (09/15 0535) Pulse Rate:  [71-110] 102 (09/15 0552) Resp:  [15-19] 19 (09/15 0552) BP: (135-176)/(75-112) 176/101 (09/15 0552) SpO2:  [94 %-100 %] 99 % (09/15 0552)  Weight:  [90.3 kg] 90.3 kg (09/15 0007)   General:   Pleasant male in NAD Head:  Normocephalic and atraumatic. Eyes:   No icterus.   Conjunctiva pink. Ears:  Normal auditory acuity. Neck:  Supple Lungs:  Respirations even and unlabored. Lungs clear to auscultation bilaterally.   No wheezes, crackles, or rhonchi.  Heart:  Regular rate and rhythm; no MRG Abdomen:  Soft, nondistended, nontender. Normal bowel sounds. No appreciable masses or hepatomegaly.  Rectal:  Not performed.  Msk:  Symmetrical without gross deformities.  Extremities:  Without edema. Neurologic:  Alert and  oriented x4;  grossly normal neurologically. Skin:  Intact without significant lesions or rashes. Psych:  Alert and cooperative. Normal affect.  LAB RESULTS: Recent Labs    11/03/17 0341  HGB 14.6  HCT 43.0    BMET Recent Labs    11/03/17 0341  NA 140  K 3.8  CL 110  GLUCOSE 107*  BUN 16  CREATININE 0.90   LFT No results for input(s): PROT, ALBUMIN, AST, ALT, ALKPHOS, BILITOT, BILIDIR, IBILI in the last 72 hours. PT/INR No results for input(s): LABPROT, INR in the last 72 hours.  STUDIES: No results found.   PREVIOUS ENDOSCOPIES:            EGD in 1970's for PUD   Impression / Plan:   84 yo male presenting with acute food impaction requiring urgent endoscopic intervention for removal. Will plan on EGD with moderate sedation in ER.   The indications, risks, and benefits of EGD were explained to the patient in detail. Risks include but are not limited to bleeding, perforation, adverse reaction to medications, and cardiopulmonary compromise. Sequelae include but are not limited to the possibility of surgery, hositalization, and mortality. The patient verbalized understanding and wished to proceed. All questions answered. Further recommendations pending results of the exam.    Thanks   LOS: 0 days   [place]  11/03/2017, 6:01 AM

## 2017-11-03 NOTE — ED Provider Notes (Addendum)
[facility] DEPT Provider Note   CSN: [id number] Arrival date & time: 11/02/17  2320  Time seen 02:28 AM    History   Chief Complaint Chief Complaint  Patient presents with  . Foreign Body    throat    HPI Anthony Mclaughlin is a 84 y.o. male.  HPI patient states he has had some problems in the past if he eats a hot dog to fastly or does not chew well he feels like his food can get stuck.  He states sometimes he has to resolve it by drinking water and vomiting.  He states on September 14 at about 12:30 PM he was eating ribs and a hamburger and feels like something got stuck.  He states even water and his saliva do not go down.  He states he has been spitting his saliva in a cup.  He states he had a history of peptic ulcer disease in the 1990s.  He denies any recent GERD symptoms.  He states he is never had to have his esophagus stretched.  He states he has never discussed this with his primary care doctor.  PCP Leanna Battles, MD   Past Medical History:  Diagnosis Date  . Frequency of urination   . History of bleeding peptic ulcer    1997  s/p  surgery  . Phimosis   . Urgency of urination   . Wears glasses   . Wears partial dentures    upper    Patient Active Problem List   Diagnosis Date Noted  . Food impaction of esophagus   . Esophageal dysphagia   . Esophageal stricture     Past Surgical History:  Procedure Laterality Date  . CATARACT EXTRACTION W/ INTRAOCULAR LENS  IMPLANT, BILATERAL  2015  . CIRCUMCISION N/A 06/04/2016   Procedure: CIRCUMCISION ADULT;  Surgeon: Kathie Rhodes, MD;  Location: [facility];  Service: Urology;  Laterality: N/A;  . GASTROTOMY  1997   bleeding peptic ulcer        Home Medications    Prior to Admission medications   Medication Sig Start Date End Date Taking? Authorizing Provider  Cyanocobalamin (B-12) 1000 MCG CAPS Take 1 tablet by mouth daily.   Yes [provider]  Multiple  Vitamin (MULTIVITAMIN) tablet Take 1 tablet by mouth daily.   Yes [provider]  Omega-3 Fatty Acids (FISH OIL) 1000 MG CAPS Take 1 capsule by mouth daily.   Yes [provider]  pantoprazole (PROTONIX) 40 MG tablet Take 1 tablet (40 mg total) by mouth 2 (two) times daily. 11/03/17   Rolland Porter, MD  sucralfate (CARAFATE) 1 GM/10ML suspension Take 10 mLs (1 g total) by mouth 4 (four) times daily -  with meals and at bedtime. 11/03/17   Rolland Porter, MD    Family History History reviewed. No pertinent family history.  Social History Social History   Tobacco Use  . Smoking status: Former Smoker    Years: 1.00    Types: Cigarettes    Last attempt to quit: 05/30/1972    Years since quitting: 45.4  . Smokeless tobacco: Never Used  Substance Use Topics  . Alcohol use: No  . Drug use: No  lives at home Lives with spouse   Allergies   Patient has no known allergies.   Review of Systems Review of Systems  All other systems reviewed and are negative.    Physical Exam Updated Vital Signs BP (!) 121/53 (BP Location: Left Arm)  Pulse 84   Temp 97.6 F (36.4 C) (Oral)   Resp 15   Ht 6\' 2"  (1.88 m)   Wt 90.3 kg   SpO2 95%   BMI 25.55 kg/m   Physical Exam  Constitutional: He is oriented to person, place, and time. He appears well-developed and well-nourished.  Non-toxic appearance. He does not appear ill. No distress.  HENT:  Head: Normocephalic and atraumatic.  Right Ear: External ear normal.  Left Ear: External ear normal.  Nose: Nose normal. No mucosal edema or rhinorrhea.  Mouth/Throat: Oropharynx is clear and moist and mucous membranes are normal. No dental abscesses or uvula swelling.  Eyes: Pupils are equal, round, and reactive to light. Conjunctivae and EOM are normal.  Neck: Normal range of motion and full passive range of motion without pain. Neck supple.  Cardiovascular: Normal rate, regular rhythm and normal heart sounds. Exam reveals no gallop  and no friction rub.  No murmur heard. Pulmonary/Chest: Effort normal and breath sounds normal. No respiratory distress. He has no wheezes. He has no rhonchi. He has no rales. He exhibits no tenderness and no crepitus.  Abdominal: Soft. Normal appearance and bowel sounds are normal. He exhibits no distension. There is no tenderness. There is no rebound and no guarding.  Musculoskeletal: Normal range of motion. He exhibits no edema or tenderness.  Moves all extremities well.   Neurological: He is alert and oriented to person, place, and time. He has normal strength. No cranial nerve deficit.  Skin: Skin is warm, dry and intact. No rash noted. No erythema. No pallor.  Psychiatric: He has a normal mood and affect. His speech is normal and behavior is normal. His mood appears not anxious.  Nursing note and vitals reviewed.    ED Treatments / Results  Labs (all labs ordered are listed, but only abnormal results are displayed) Results for orders placed or performed during the hospital encounter of 11/03/17  I-stat Chem 8, ED  Result Value Ref Range   Sodium 140 135 - 145 mmol/L   Potassium 3.8 3.5 - 5.1 mmol/L   Chloride 110 98 - 111 mmol/L   BUN 16 8 - 23 mg/dL   Creatinine, Ser 0.90 0.61 - 1.24 mg/dL   Glucose, Bld 107 (H) 70 - 99 mg/dL   Calcium, Ion 1.05 (L) 1.15 - 1.40 mmol/L   TCO2 22 22 - 32 mmol/L   Hemoglobin 14.6 13.0 - 17.0 g/dL   HCT 43.0 39.0 - 52.0 %   Laboratory interpretation all normal   .EKG None  Radiology No results found.  Procedures Procedures (including critical care time)  Medications Ordered in ED Medications  0.9 %  sodium chloride infusion (has no administration in time range)  sodium chloride 0.9 % bolus 1,000 mL (0 mLs Intravenous Stopped 11/03/17 0654)     Initial Impression / Assessment and Plan / ED Course  I have reviewed the triage vital signs and the nursing notes.  Pertinent labs & imaging results that were available during my care of  the patient were reviewed by me and considered in my medical decision making (see chart for details).    I gave patient a small glass of water, he drank a small amount and said he felt like it did not go down, he had to spit it back up.  Patient had an IV inserted and was given IV fluids because he is concerned to being dehydrated.  I discussed with him talking to the gastroenterologist to have them  come see him in the ED and to resolve his food impaction.  Patient is agreeable.  He does not have a gastroenterologist.  02:54 AM Dr Bryan Lemma, GI, will come see patient in the ED. he is going to do endoscopy in the ED.  There is a 7:50 AM patient has had his endoscopy, the food bolus was pushed into the stomach.  Patient was noted to have 2 esophageal strictures.  Patient is awake and alert and able to swallow.  He is ready to be discharged.  It has been an  Hour and a half after his procedure ended and 2 hours after he received his sedation.  He was given Dr. Vivia Ewing discharge instructions.  Final Clinical Impressions(s) / ED Diagnoses   Final diagnoses:  Food impaction of esophagus, initial encounter  Esophageal stricture     ED Discharge Orders         Ordered    pantoprazole (PROTONIX) 40 MG tablet  2 times daily     11/03/17 0752    sucralfate (CARAFATE) 1 GM/10ML suspension  3 times daily with meals & bedtime     11/03/17 1610          Plan discharge    Rolland Porter, MD, Barbette Or, MD 11/03/17 9604    Rolland Porter, MD 11/03/17 [phone]

## 2017-11-03 NOTE — ED Triage Notes (Signed)
Pt stated "I got meat stuck in my throat around noon.  I was drooling earlier.  Now I can't drink water."  Pt is in no respiratory distress, speaking in complete sentences, no drooling noted.

## 2017-11-03 NOTE — Discharge Instructions (Signed)
Dr. Bryan Lemma when she did take the Protonix twice a day for 8 weeks.  Take the Carafate 1 g 4 times a day for 2 weeks.  He wants to to be on a full liquid diet for 2 days then advance to a soft diet until he follows up with you.  Please call his office tomorrow morning to get a follow-up appointment this week.  Return to the emergency department if you get fever, cough, shortness of breath, or you are unable to swallow again.

## 2017-11-04 ENCOUNTER — Encounter (HOSPITAL_COMMUNITY): Payer: Self-pay | Admitting: Gastroenterology

## 2017-11-05 DIAGNOSIS — C44219 Basal cell carcinoma of skin of left ear and external auricular canal: Secondary | ICD-10-CM | POA: Diagnosis not present

## 2017-11-05 DIAGNOSIS — D485 Neoplasm of uncertain behavior of skin: Secondary | ICD-10-CM | POA: Diagnosis not present

## 2017-11-05 DIAGNOSIS — L570 Actinic keratosis: Secondary | ICD-10-CM | POA: Diagnosis not present

## 2017-11-07 ENCOUNTER — Telehealth: Payer: Self-pay

## 2017-11-07 NOTE — Telephone Encounter (Signed)
Patient is scheduled for EGD on 12/19/17.  He would like to see Dr. Bryan Lemma next week as scheduled to discuss advancing diet.

## 2017-11-07 NOTE — Telephone Encounter (Signed)
-----   Message from Lavena Bullion, DO sent at 11/03/2017  6:40 AM EDT ----- Regarding: Follow-up and Endo appt to schedule Anthony Mclaughlin,  Can you please arrange for a follow-up appointment for this patient for me for approximately 3-4 weeks and repeat EGD with dilation in approximately 6-8 weeks? Thank you!  VC

## 2017-11-07 NOTE — Telephone Encounter (Signed)
Left message for patient to call back  

## 2017-11-11 ENCOUNTER — Encounter: Payer: Self-pay | Admitting: Gastroenterology

## 2017-11-11 ENCOUNTER — Ambulatory Visit (INDEPENDENT_AMBULATORY_CARE_PROVIDER_SITE_OTHER): Payer: Medicare Other | Admitting: Gastroenterology

## 2017-11-11 VITALS — BP 134/88 | HR 90 | Ht 74.5 in | Wt 201.1 lb

## 2017-11-11 DIAGNOSIS — R1319 Other dysphagia: Secondary | ICD-10-CM

## 2017-11-11 DIAGNOSIS — R1310 Dysphagia, unspecified: Secondary | ICD-10-CM | POA: Diagnosis not present

## 2017-11-11 DIAGNOSIS — K222 Esophageal obstruction: Secondary | ICD-10-CM | POA: Diagnosis not present

## 2017-11-11 NOTE — Progress Notes (Signed)
P  Chief Complaint:    Dysphagia, recent food impaction   HPI:     Patient is a 84 y.o. male presenting to the [facility]ology [facility] for follow-up after recent food impaction requiring endoscopic retrieval in the emergency department on 11/03/2017.  He was discharged home on Protonix 40 mg p.o. bid and Carafate 10 mL's 4 times daily with recommendation for soft foods and repeat EGD in 6 to 8 weeks to check for healing and probable esophageal dilation.  No biopsies performed at that time.   Today he states he is continue all medications as prescribed along with maintaining a soft food diet without any issues.  No recurrence of index symptoms.  He does state that he has been having intermittent solid food dysphagia for approximately 40 years, but never with a food impaction.  He would like to advance his diet if possible but otherwise is without any concerns or complaints.  Endoscopic history: -EGD (11/03/2017): Performed for acute food impaction.  Food and lower third of the esophagus advanced into stomach.  2 benign-appearing intrinsic strictures in the lower esophagus with the narrowest measuring 11 mm in diameter located at the GE junction and less severe stricture located 35 cm from incisors.  Nonbleeding esophageal diverticulum.  Altered gastric anatomy secondary to gastric surgery for PUD in 1997.  Normal duodenum. - EGD approximately 1997 for bleeding gastric ulcer per patient   Review of systems:     No chest pain, no SOB, no fevers, no urinary sx   Past Medical History:  Diagnosis Date  . Frequency of urination   . History of bleeding peptic ulcer    1997  s/p  surgery  . Phimosis   . Urgency of urination   . Wears glasses   . Wears partial dentures    upper    Patient's surgical history, family medical history, social history, medications and allergies were all reviewed in Epic    Current Outpatient Medications  Medication Sig Dispense Refill  . Multiple Vitamin  (MULTIVITAMIN) tablet Take 1 tablet by mouth daily.    . Omega-3 Fatty Acids (FISH OIL) 1000 MG CAPS Take 1 capsule by mouth daily.    . pantoprazole (PROTONIX) 40 MG tablet Take 1 tablet (40 mg total) by mouth 2 (two) times daily. 60 tablet 1  . sucralfate (CARAFATE) 1 GM/10ML suspension Take 10 mLs (1 g total) by mouth 4 (four) times daily -  with meals and at bedtime. 420 mL 0   No current facility-administered medications for this visit.     Physical Exam:     BP 134/88   Pulse 90   Ht 6' 2.5" (1.892 m)   Wt 201 lb 2 oz (91.2 kg)   BMI 25.48 kg/m   GENERAL:  Pleasant male in NAD PSYCH: : Cooperative, normal affect EENT:  conjunctiva pink, mucous membranes moist, neck supple without masses CARDIAC:  RRR, no murmur heard, no peripheral edema PULM: Normal respiratory effort, lungs CTA bilaterally, no wheezing ABDOMEN:  Nondistended, soft, nontender. No obvious masses, no hepatomegaly,  normal bowel sounds SKIN:  turgor, no lesions seen Musculoskeletal:  Normal muscle tone, normal strength NEURO: Alert and oriented x 3, no focal neurologic deficits   IMPRESSION and PLAN:    #1.  Dysphasia: Long-standing history of solid food dysphagia with recent food impaction requiring endoscopic intervention.  EGD notable for moderately severe stricture at the GE junction along with less severe stricture at 35 cm from incisors. - Okay  to stop Carafate -Resume Protonix 40 mg p.o. twice daily for now - Schedule EGD with balloon dilation in late October (6 to 8 weeks from ER evaluation on 11/03/2017) - Okay to slowly advance diet -Advised patient to cut food into small pieces, eat small bites, chew food thoroughly and with plenty of liquids to avoid food impaction.  Has return precautions.  #2.  Esophageal stricture: As above we will plan on EGD with balloon dilation.  We will continue to manage medically as above.  The indications, risks, and benefits of EGD were explained to the patient  in detail. Risks include but are not limited to bleeding, perforation, adverse reaction to medications, and cardiopulmonary compromise. Sequelae include but are not limited to the possibility of surgery, hositalization, and mortality. The patient verbalized understanding and wished to proceed. All questions answered, referred to scheduler. Further recommendations pending results of the exam.        Lavena Bullion ,DO, FACG 11/11/2017, 10:32 AM

## 2017-11-11 NOTE — Patient Instructions (Addendum)
If you are age 82 or older, your body mass index should be between 23-30. Your Body mass index is 25.48 kg/m. If this is out of the aforementioned range listed, please consider follow up with your Primary Care Provider.  If you are age 68 or younger, your body mass index should be between 19-25. Your Body mass index is 25.48 kg/m. If this is out of the aformentioned range listed, please consider follow up with your Primary Care Provider.   You have been scheduled for an endoscopy. Please follow written instructions given to you at your visit today. If you use inhalers (even only as needed), please bring them with you on the day of your procedure. Your physician has requested that you go to www.startemmi.com and enter the access code given to you at your visit today. This web site gives a general overview about your procedure. However, you should still follow specific instructions given to you by our office regarding your preparation for the procedure.  Stop taking your Carafate and continue taking Protonix.  It is okay to slowly advance your diet as you can tolerate.  Call back to schedule your 3-6 month follow-up.  It was a pleasure to see you today!  Vito Cirigliano, D.O.

## 2017-11-21 DIAGNOSIS — C44219 Basal cell carcinoma of skin of left ear and external auricular canal: Secondary | ICD-10-CM | POA: Diagnosis not present

## 2017-11-21 DIAGNOSIS — D225 Melanocytic nevi of trunk: Secondary | ICD-10-CM | POA: Diagnosis not present

## 2017-11-21 DIAGNOSIS — Z23 Encounter for immunization: Secondary | ICD-10-CM | POA: Diagnosis not present

## 2017-11-21 DIAGNOSIS — L814 Other melanin hyperpigmentation: Secondary | ICD-10-CM | POA: Diagnosis not present

## 2017-11-21 DIAGNOSIS — L821 Other seborrheic keratosis: Secondary | ICD-10-CM | POA: Diagnosis not present

## 2017-11-21 DIAGNOSIS — Z85828 Personal history of other malignant neoplasm of skin: Secondary | ICD-10-CM | POA: Diagnosis not present

## 2017-12-19 ENCOUNTER — Encounter: Payer: Medicare Other | Admitting: Gastroenterology

## 2017-12-23 ENCOUNTER — Ambulatory Visit (AMBULATORY_SURGERY_CENTER): Payer: Medicare Other | Admitting: Gastroenterology

## 2017-12-23 ENCOUNTER — Encounter: Payer: Self-pay | Admitting: Gastroenterology

## 2017-12-23 VITALS — BP 140/79 | HR 72 | Temp 96.9°F | Resp 20 | Ht 74.5 in | Wt 201.0 lb

## 2017-12-23 DIAGNOSIS — Q396 Congenital diverticulum of esophagus: Secondary | ICD-10-CM

## 2017-12-23 DIAGNOSIS — R1310 Dysphagia, unspecified: Secondary | ICD-10-CM | POA: Diagnosis not present

## 2017-12-23 DIAGNOSIS — R1319 Other dysphagia: Secondary | ICD-10-CM

## 2017-12-23 DIAGNOSIS — Z9889 Other specified postprocedural states: Secondary | ICD-10-CM

## 2017-12-23 DIAGNOSIS — K449 Diaphragmatic hernia without obstruction or gangrene: Secondary | ICD-10-CM

## 2017-12-23 MED ORDER — SODIUM CHLORIDE 0.9 % IV SOLN: 500.0000 mL | Freq: Once | INTRAVENOUS | Status: DC

## 2017-12-23 NOTE — Op Note (Signed)
[facility] Patient Name: Colton Tassin Procedure Date: 12/23/2017 7:59 AM MRN: [id number] Endoscopist: Gerrit Heck , MD Age: 84 Referring MD:  Date of Birth: Kamuela 22, 1935 Gender: Male Account #: [id number] Procedure:                Upper GI endoscopy Indications:              Dysphagia, History of food imaction.                           84 yo male with a 40+ year history of intermittent                            solid food dysphagia, with recent food impaction in                            September 2019 requiring endoscopic retrieval. Was                            started on Protonix 40 mg pO BID and since then has                            had no clinical recurrence of dysphagia, now                            tolerating all PO intake without issues. He                            presents today for endoscopic evaluation and                            dilation if indicated. Medicines:                Monitored Anesthesia Care Procedure:                Pre-Anesthesia Assessment:                           - Prior to the procedure, a History and Physical                            was performed, and patient medications and                            allergies were reviewed. The patient's tolerance of                            previous anesthesia was also reviewed. The risks                            and benefits of the procedure and the sedation                            options and risks were discussed with the patient.  All questions were answered, and informed consent                            was obtained. Prior Anticoagulants: The patient has                            taken no previous anticoagulant or antiplatelet                            agents. ASA Grade Assessment: II - A patient with                            mild systemic disease. After reviewing the risks                            and benefits, the patient was deemed in                         satisfactory condition to undergo the procedure.                           After obtaining informed consent, the endoscope was                            passed under direct vision. Throughout the                            procedure, the patient's blood pressure, pulse, and                            oxygen saturations were monitored continuously. The                            Endoscope was introduced through the mouth, and                            advanced to the second part of duodenum. The upper                            GI endoscopy was accomplished without difficulty.                            The patient tolerated the procedure well. Scope In: Scope Out: Findings:                 Esophagogastric landmarks were identified: the                            Z-line was found at 34 cm, the gastroesophageal                            junction was found at 34 cm and the site of hiatal  narrowing was found at 43 cm from the incisors.                           A very large 9 cm hiatal hernia was present. The                            mucosa was otherwise normal appearing in the hernia.                           At least two medium sized non-bleeding diverticula                            with no stigmata of recent bleeding were found in                            the lower third of the esophagus. The location                            corresponds to the site of the recent food                            impaction. The mucosa was otherwise normal                            appearing, and the previously noted erythema and                            irritation has since resolved.                           The lower third of the esophagus was moderately                            tortuous.                           The upper third of the esophagus and middle third                            of the esophagus were normal.                            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 This was characterized by healthy appearing                            mucosa.                           Normal mucosa was found in the cardia, in the  gastric fundus and in the gastric body. A large                            hiatal hernia was noted on retroflexed views.                           The duodenal bulb, first portion of the duodenum                            and second portion of the duodenum were normal. Complications:            No immediate complications. Estimated Blood Loss:     Estimated blood loss: none. Impression:               - Esophagogastric landmarks identified.                           - 9 cm hiatal hernia.                           - Diverticulum in the lower third of the esophagus.                           - Tortuous esophagus.                           - Normal upper third of esophagus and middle third                            of esophagus.                           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                           - Normal mucosa was found in the cardia, in the                            gastric fundus and in the gastric body.                           - Normal duodenal bulb, first portion of the                            duodenum and second portion of the duodenum.                           - Of note, the previously noted area of mucosal                            erythema and irritation has since healed. The  suspected stenosis on prior study may have only                            been localized mucosal edema secondary to the food                            impaction. Food impaction could have been instead                            secondary to the diverticula and/or                            presbyesophagus. Given his clinical  resolution of                            dysphagia and lack of stenosis on this study, no                            esophageal dilation performed. Recommendation:           - Patient has a contact number available for                            emergencies. The signs and symptoms of potential                            delayed complications were discussed with the                            patient. Return to normal activities tomorrow.                            Written discharge instructions were provided to the                            patient.                           - Resume previous diet today.                           - Use Protonix (pantoprazole) 40 mg PO daily for 6                            weeks, then continue to titrate to lowest effective                            dose or discontinue completely as tolerated.                           - Return to GI clinic in 2 months.                           - Can consider surgical referral for noted large  hiatal hernia. Gerrit Heck, MD 12/23/2017 8:29:06 AM

## 2017-12-23 NOTE — Progress Notes (Signed)
PT taken to PACU. Monitors in place. VSS. Report given to RN. 

## 2017-12-23 NOTE — Patient Instructions (Signed)
Please return to GI clinic in 6 weeks. Please read handout on Hiatal Hernia. Use Protonix (Pantoprazole) 40 mg daily for 6 weeks, then continue to titrate to lowest effective dose or discontinue completely as tolerated. Consider surgical referral for hiatal hernia.    YOU HAD AN ENDOSCOPIC PROCEDURE TODAY AT [facility]:   Refer to the procedure report that was given to you for any specific questions about what was found during the examination.  If the procedure report does not answer your questions, please call your gastroenterologist to clarify.  If you requested that your care partner not be given the details of your procedure findings, then the procedure report has been included in a sealed envelope for you to review at your convenience later.  YOU SHOULD EXPECT: Some feelings of bloating in the abdomen. Passage of more gas than usual.  Walking can help get rid of the air that was put into your GI tract during the procedure and reduce the bloating. If you had a lower endoscopy (such as a colonoscopy or flexible sigmoidoscopy) you may notice spotting of blood in your stool or on the toilet paper. If you underwent a bowel prep for your procedure, you may not have a normal bowel movement for a few days.  Please Note:  You might notice some irritation and congestion in your nose or some drainage.  This is from the oxygen used during your procedure.  There is no need for concern and it should clear up in a day or so.  SYMPTOMS TO REPORT IMMEDIATELY:     Following upper endoscopy (EGD)  Vomiting of blood or coffee ground material  New chest pain or pain under the shoulder blades  Painful or persistently difficult swallowing  New shortness of breath  Fever of 100F or higher  Black, tarry-looking stools  For urgent or emergent issues, a gastroenterologist can be reached at any hour by calling [phone].   DIET:  We do recommend a small meal at first, but then you may  proceed to your regular diet.  Drink plenty of fluids but you should avoid alcoholic beverages for 24 hours.  ACTIVITY:  You should plan to take it easy for the rest of today and you should NOT DRIVE or use heavy machinery until tomorrow (because of the sedation medicines used during the test).    FOLLOW UP: Our staff will call the number listed on your records the next business day following your procedure to check on you and address any questions or concerns that you may have regarding the information given to you following your procedure. If we do not reach you, we will leave a message.  However, if you are feeling well and you are not experiencing any problems, there is no need to return our call.  We will assume that you have returned to your regular daily activities without incident.  If any biopsies were taken you will be contacted by phone or by letter within the next 1-3 weeks.  Please call us at [phone] if you have not heard about the biopsies in 3 weeks.    SIGNATURES/CONFIDENTIALITY: You and/or your care partner have signed paperwork which will be entered into your electronic medical record.  These signatures attest to the fact that that the information above on your After Visit Summary has been reviewed and is understood.  Full responsibility of the confidentiality of this discharge information lies with you and/or your care-partner.

## 2017-12-24 ENCOUNTER — Telehealth: Payer: Self-pay | Admitting: *Deleted

## 2017-12-24 NOTE — Telephone Encounter (Signed)
  Follow up Call-  Call back number 12/23/2017  Post procedure Call Back phone  # [phone]  Permission to leave phone message Yes  Some recent data might be hidden     Patient questions:  Do you have a fever, pain , or abdominal swelling? No. Pain Score  0 *  Have you tolerated food without any problems? Yes.    Have you been able to return to your normal activities? Yes.    Do you have any questions about your discharge instructions: Diet   No. Medications  Yes.   Follow up visit  No.  Do you have questions or concerns about your Care? Yes.    Actions: * If pain score is 4 or above: No action needed, pain <4.

## 2018-01-29 DIAGNOSIS — L570 Actinic keratosis: Secondary | ICD-10-CM | POA: Diagnosis not present

## 2018-01-29 DIAGNOSIS — C44219 Basal cell carcinoma of skin of left ear and external auricular canal: Secondary | ICD-10-CM | POA: Diagnosis not present

## 2018-02-26 DIAGNOSIS — H35371 Puckering of macula, right eye: Secondary | ICD-10-CM | POA: Diagnosis not present

## 2018-02-26 DIAGNOSIS — Z961 Presence of intraocular lens: Secondary | ICD-10-CM | POA: Diagnosis not present

## 2018-02-26 DIAGNOSIS — H524 Presbyopia: Secondary | ICD-10-CM | POA: Diagnosis not present

## 2018-04-10 DIAGNOSIS — E7849 Other hyperlipidemia: Secondary | ICD-10-CM | POA: Diagnosis not present

## 2018-04-10 DIAGNOSIS — R82998 Other abnormal findings in urine: Secondary | ICD-10-CM | POA: Diagnosis not present

## 2018-04-10 DIAGNOSIS — Z125 Encounter for screening for malignant neoplasm of prostate: Secondary | ICD-10-CM | POA: Diagnosis not present

## 2018-04-17 DIAGNOSIS — M545 Low back pain: Secondary | ICD-10-CM | POA: Diagnosis not present

## 2018-04-17 DIAGNOSIS — Z1331 Encounter for screening for depression: Secondary | ICD-10-CM | POA: Diagnosis not present

## 2018-04-17 DIAGNOSIS — Z6826 Body mass index (BMI) 26.0-26.9, adult: Secondary | ICD-10-CM | POA: Diagnosis not present

## 2018-04-17 DIAGNOSIS — E7849 Other hyperlipidemia: Secondary | ICD-10-CM | POA: Diagnosis not present

## 2018-04-17 DIAGNOSIS — Z0000 Encounter for general adult medical examination without abnormal findings: Secondary | ICD-10-CM | POA: Diagnosis not present

## 2018-09-01 DIAGNOSIS — M25571 Pain in right ankle and joints of right foot: Secondary | ICD-10-CM | POA: Diagnosis not present

## 2018-09-01 DIAGNOSIS — M25561 Pain in right knee: Secondary | ICD-10-CM | POA: Diagnosis not present

## 2018-09-01 DIAGNOSIS — M25461 Effusion, right knee: Secondary | ICD-10-CM | POA: Diagnosis not present

## 2019-03-14 ENCOUNTER — Ambulatory Visit: Payer: Medicare Other

## 2019-03-15 ENCOUNTER — Ambulatory Visit: Payer: Medicare Other | Attending: Internal Medicine

## 2019-03-15 DIAGNOSIS — Z23 Encounter for immunization: Secondary | ICD-10-CM | POA: Insufficient documentation

## 2019-03-15 NOTE — Progress Notes (Signed)
   Covid-19 Vaccination Clinic  Name:  Anthony Mclaughlin    MRN: [id number] DOB: [birth year]  03/15/2019  [name] was observed post Covid-19 immunization for 15 minutes without incidence. He was provided with Vaccine Information Sheet and instruction to access the V-Safe system.   [name] was instructed to call 911 with any severe reactions post vaccine: Marland Kitchen Difficulty breathing  . Swelling of your face and throat  . A fast heartbeat  . A bad rash all over your body  . Dizziness and weakness    Immunizations Administered    Name Date Dose VIS Date Route   Pfizer COVID-19 Vaccine 03/15/2019 12:45 PM 0.3 mL 01/30/2019 Intramuscular   Manufacturer: [place]   Lot: [id number]   [place]: [id number]

## 2019-04-06 ENCOUNTER — Ambulatory Visit: Payer: Medicare Other | Attending: Internal Medicine

## 2019-04-06 DIAGNOSIS — Z23 Encounter for immunization: Secondary | ICD-10-CM

## 2019-04-06 NOTE — Progress Notes (Signed)
   Covid-19 Vaccination Clinic  Name:  Leal Pascall    MRN: [id number] DOB: 22-Nov-1933  04/06/2019  Mr. Lemm was observed post Covid-19 immunization for 15 minutes without incidence. He was provided with Vaccine Information Sheet and instruction to access the V-Safe system.   Mr. Nesler was instructed to call 911 with any severe reactions post vaccine: Marland Kitchen Difficulty breathing  . Swelling of your face and throat  . A fast heartbeat  . A bad rash all over your body  . Dizziness and weakness    Immunizations Administered    Name Date Dose VIS Date Route   Pfizer COVID-19 Vaccine 04/06/2019  8:46 AM 0.3 mL 01/30/2019 Intramuscular   Manufacturer: [place]   Lot: [id number]   [place]: [id number]

## 2019-04-21 DIAGNOSIS — Z125 Encounter for screening for malignant neoplasm of prostate: Secondary | ICD-10-CM | POA: Diagnosis not present

## 2019-04-21 DIAGNOSIS — E7849 Other hyperlipidemia: Secondary | ICD-10-CM | POA: Diagnosis not present

## 2019-04-24 DIAGNOSIS — H52203 Unspecified astigmatism, bilateral: Secondary | ICD-10-CM | POA: Diagnosis not present

## 2019-04-24 DIAGNOSIS — Z961 Presence of intraocular lens: Secondary | ICD-10-CM | POA: Diagnosis not present

## 2019-04-28 DIAGNOSIS — E785 Hyperlipidemia, unspecified: Secondary | ICD-10-CM | POA: Diagnosis not present

## 2019-04-28 DIAGNOSIS — Z1331 Encounter for screening for depression: Secondary | ICD-10-CM | POA: Diagnosis not present

## 2019-04-28 DIAGNOSIS — Z0000 Encounter for general adult medical examination without abnormal findings: Secondary | ICD-10-CM | POA: Diagnosis not present

## 2019-04-28 DIAGNOSIS — H9193 Unspecified hearing loss, bilateral: Secondary | ICD-10-CM | POA: Diagnosis not present

## 2019-04-28 DIAGNOSIS — R82998 Other abnormal findings in urine: Secondary | ICD-10-CM | POA: Diagnosis not present

## 2019-04-28 DIAGNOSIS — K222 Esophageal obstruction: Secondary | ICD-10-CM | POA: Diagnosis not present

## 2019-04-28 DIAGNOSIS — Z1339 Encounter for screening examination for other mental health and behavioral disorders: Secondary | ICD-10-CM | POA: Diagnosis not present

## 2019-04-28 DIAGNOSIS — M545 Low back pain: Secondary | ICD-10-CM | POA: Diagnosis not present

## 2019-11-10 DIAGNOSIS — Z23 Encounter for immunization: Secondary | ICD-10-CM | POA: Diagnosis not present

## 2020-01-04 DIAGNOSIS — Z23 Encounter for immunization: Secondary | ICD-10-CM | POA: Diagnosis not present

## 2020-04-29 DIAGNOSIS — Z125 Encounter for screening for malignant neoplasm of prostate: Secondary | ICD-10-CM | POA: Diagnosis not present

## 2020-04-29 DIAGNOSIS — E785 Hyperlipidemia, unspecified: Secondary | ICD-10-CM | POA: Diagnosis not present

## 2020-05-05 DIAGNOSIS — K449 Diaphragmatic hernia without obstruction or gangrene: Secondary | ICD-10-CM | POA: Diagnosis not present

## 2020-05-05 DIAGNOSIS — Z0000 Encounter for general adult medical examination without abnormal findings: Secondary | ICD-10-CM | POA: Diagnosis not present

## 2020-05-05 DIAGNOSIS — E785 Hyperlipidemia, unspecified: Secondary | ICD-10-CM | POA: Diagnosis not present

## 2020-05-05 DIAGNOSIS — Z1331 Encounter for screening for depression: Secondary | ICD-10-CM | POA: Diagnosis not present

## 2020-05-05 DIAGNOSIS — Z1339 Encounter for screening examination for other mental health and behavioral disorders: Secondary | ICD-10-CM | POA: Diagnosis not present

## 2020-05-05 DIAGNOSIS — R3911 Hesitancy of micturition: Secondary | ICD-10-CM | POA: Diagnosis not present

## 2020-05-05 DIAGNOSIS — R82998 Other abnormal findings in urine: Secondary | ICD-10-CM | POA: Diagnosis not present

## 2020-05-05 DIAGNOSIS — H9193 Unspecified hearing loss, bilateral: Secondary | ICD-10-CM | POA: Diagnosis not present

## 2020-06-14 DIAGNOSIS — H52203 Unspecified astigmatism, bilateral: Secondary | ICD-10-CM | POA: Diagnosis not present

## 2020-06-14 DIAGNOSIS — Z961 Presence of intraocular lens: Secondary | ICD-10-CM | POA: Diagnosis not present

## 2020-06-20 DIAGNOSIS — Z23 Encounter for immunization: Secondary | ICD-10-CM | POA: Diagnosis not present

## 2020-08-23 ENCOUNTER — Other Ambulatory Visit: Payer: Self-pay | Admitting: Internal Medicine

## 2020-08-23 DIAGNOSIS — R1313 Dysphagia, pharyngeal phase: Secondary | ICD-10-CM

## 2020-08-23 DIAGNOSIS — K449 Diaphragmatic hernia without obstruction or gangrene: Secondary | ICD-10-CM

## 2020-09-01 ENCOUNTER — Ambulatory Visit
Admission: RE | Admit: 2020-09-01 | Discharge: 2020-09-01 | Disposition: A | Discharge status: Home / Self Care | Payer: Medicare Other | Source: Ambulatory Visit | Attending: Internal Medicine | Admitting: Internal Medicine

## 2020-09-01 DIAGNOSIS — R1310 Dysphagia, unspecified: Secondary | ICD-10-CM | POA: Diagnosis not present

## 2020-09-01 DIAGNOSIS — K219 Gastro-esophageal reflux disease without esophagitis: Secondary | ICD-10-CM | POA: Diagnosis not present

## 2020-09-01 DIAGNOSIS — K449 Diaphragmatic hernia without obstruction or gangrene: Secondary | ICD-10-CM

## 2020-09-01 DIAGNOSIS — R1313 Dysphagia, pharyngeal phase: Secondary | ICD-10-CM

## 2020-10-12 ENCOUNTER — Ambulatory Visit (INDEPENDENT_AMBULATORY_CARE_PROVIDER_SITE_OTHER): Payer: Medicare Other | Admitting: Gastroenterology

## 2020-10-12 ENCOUNTER — Other Ambulatory Visit: Payer: Self-pay

## 2020-10-12 ENCOUNTER — Encounter: Payer: Self-pay | Admitting: Gastroenterology

## 2020-10-12 VITALS — BP 142/88 | HR 80 | Ht 74.5 in | Wt 215.2 lb

## 2020-10-12 DIAGNOSIS — Q396 Congenital diverticulum of esophagus: Secondary | ICD-10-CM

## 2020-10-12 DIAGNOSIS — R933 Abnormal findings on diagnostic imaging of other parts of digestive tract: Secondary | ICD-10-CM | POA: Diagnosis not present

## 2020-10-12 DIAGNOSIS — K449 Diaphragmatic hernia without obstruction or gangrene: Secondary | ICD-10-CM | POA: Diagnosis not present

## 2020-10-12 DIAGNOSIS — R1310 Dysphagia, unspecified: Secondary | ICD-10-CM | POA: Diagnosis not present

## 2020-10-12 NOTE — Progress Notes (Signed)
Chief Complaint:    Dysphagia  HPI:     Patient is a 87 y.o. male presenting to the Gastroenterology Clinic for follow-up.  Last seen by me on 11/11/2017 as follow-up from food impaction on 11/03/2017.  40+ year history of solid food dysphagia.  Endoscopic History: -EGD approximately 1997 for bleeding gastric ulcer per patient -EGD (11/03/2017): Performed for acute food impaction.  Food and lower third of the esophagus advanced into stomach.  2 benign-appearing intrinsic strictures in the lower esophagus with the narrowest measuring 11 mm in diameter located at the GE junction and less severe stricture located 35 cm from incisors.  Nonbleeding esophageal diverticulum.  Altered gastric anatomy secondary to gastric surgery for PUD in 1997.  Normal duodenum. -EGD (12/23/2017): 9 cm HH, 2 small distal esophageal diverticula, moderately tortuous lower esophagus, evidence of prior antrectomy  - Barium esophagram (09/01/2020): Mild to moderate esophageal dysmotility characterized by intermittent weakening of primary peristalsis.  Moderate to large hiatal hernia.  Moderately tortuous lower esophagus with multiple small to moderate size diverticula.  Short segment smooth moderate circumferential narrowing in the lower esophagus where barium tablet became lodged.  Mild reflux.  Today, he states his dysphagia symptoms are largely well controlled by cutting food into small pieces, chewing thoroughly, taking his time with meals, and drinking plenty of water with meals.  No recurrence of food impaction since 2019.  Review of systems:     No chest pain, no SOB, no fevers, no urinary sx   Past Medical History:  Diagnosis Date   Frequency of urination    History of bleeding peptic ulcer    1997  s/p  surgery   Phimosis    Urgency of urination    Wears glasses    Wears partial dentures    upper    Patient's surgical history, family medical history, social history, medications and allergies were all  reviewed in Epic    Current Outpatient Medications  Medication Sig Dispense Refill   Multiple Vitamin (MULTIVITAMIN) tablet Take 1 tablet by mouth daily.     No current facility-administered medications for this visit.    Physical Exam:     BP (!) 142/88   Pulse 80   Ht 6' 2.5" (1.892 m)   Wt 215 lb 4 oz (97.6 kg)   SpO2 95%   BMI 27.27 kg/m   GENERAL:  Pleasant male in NAD PSYCH: : Cooperative, normal affect Musculoskeletal:  Normal muscle tone, normal strength NEURO: Alert and oriented x 3, no focal neurologic deficits   IMPRESSION and PLAN:    1) Dysphagia 2) Esophageal diverticula 3) Presbyesophagus 4) Esophageal dysmotility on esophagram 5) Large hiatal hernia 6) Abnormal esophagram  - We had an extensive conversation today regarding the abnormal esophagram in context with his previous upper endoscopy.  Discussed treatment options to include EGD with balloon dilatation of possible distal esophageal stricture, EGD with fracturing of peptic stricture with forceps (reducing risk of perforation particularly with lower esophageal diverticula), vs continued conservative management.  Based on age, current symptomatology, procedural risks, patient prefers the latter - Continue conservative management - Continue dietary modifications as currently doing - If symptoms worsen or patient decides he would like endoscopic evaluation, plan for EGD with forceps fracturing of peptic stricture - Continue drinking plenty of water with meals - No plan for hiatal hernia repair/fundoplication per discussion with patient  RTC prn  I spent 30 minutes of time, including in depth chart review, independent review of results as  outlined above, communicating results with the patient directly, face-to-face time with the patient, coordinating care, and ordering studies and medications as appropriate, and documentation.         [place] ,DO, FACG 10/12/2020, 11:19 AM

## 2020-10-12 NOTE — Patient Instructions (Signed)
If you are age 85 or older, your body mass index should be between 23-30. Your Body mass index is 27.27 kg/m. If this is out of the aforementioned range listed, please consider follow up with your Primary Care Provider.  If you are age 60 or younger, your body mass index should be between 19-25. Your Body mass index is 27.27 kg/m. If this is out of the aformentioned range listed, please consider follow up with your Primary Care Provider.   __________________________________________________________  The [facility] providers would like to encourage you to use [facility] to communicate with providers for non-urgent requests or questions.  Due to long hold times on the telephone, sending your provider a message by [facility] may be a faster and more efficient way to get a response.  Please allow 48 business hours for a response.  Please remember that this is for non-urgent requests.   Follow up as needed.  Please call with any questions or concerns.  It was a pleasure to see you today!  Vito Cirigliano, D.O.

## 2020-11-09 DIAGNOSIS — Z23 Encounter for immunization: Secondary | ICD-10-CM | POA: Diagnosis not present

## 2020-11-24 DIAGNOSIS — Z23 Encounter for immunization: Secondary | ICD-10-CM | POA: Diagnosis not present

## 2021-06-06 DIAGNOSIS — E785 Hyperlipidemia, unspecified: Secondary | ICD-10-CM | POA: Diagnosis not present

## 2021-06-06 DIAGNOSIS — Z125 Encounter for screening for malignant neoplasm of prostate: Secondary | ICD-10-CM | POA: Diagnosis not present

## 2021-06-06 DIAGNOSIS — R7989 Other specified abnormal findings of blood chemistry: Secondary | ICD-10-CM | POA: Diagnosis not present

## 2021-06-13 DIAGNOSIS — E785 Hyperlipidemia, unspecified: Secondary | ICD-10-CM | POA: Diagnosis not present

## 2021-06-13 DIAGNOSIS — R82998 Other abnormal findings in urine: Secondary | ICD-10-CM | POA: Diagnosis not present

## 2021-06-13 DIAGNOSIS — K222 Esophageal obstruction: Secondary | ICD-10-CM | POA: Diagnosis not present

## 2021-06-13 DIAGNOSIS — Z1331 Encounter for screening for depression: Secondary | ICD-10-CM | POA: Diagnosis not present

## 2021-06-13 DIAGNOSIS — Z0000 Encounter for general adult medical examination without abnormal findings: Secondary | ICD-10-CM | POA: Diagnosis not present

## 2021-06-13 DIAGNOSIS — M545 Low back pain, unspecified: Secondary | ICD-10-CM | POA: Diagnosis not present

## 2021-06-13 DIAGNOSIS — Z1339 Encounter for screening examination for other mental health and behavioral disorders: Secondary | ICD-10-CM | POA: Diagnosis not present

## 2021-06-21 DIAGNOSIS — Z961 Presence of intraocular lens: Secondary | ICD-10-CM | POA: Diagnosis not present

## 2021-06-21 DIAGNOSIS — H52203 Unspecified astigmatism, bilateral: Secondary | ICD-10-CM | POA: Diagnosis not present

## 2021-08-02 DIAGNOSIS — Z23 Encounter for immunization: Secondary | ICD-10-CM | POA: Diagnosis not present

## 2021-09-06 ENCOUNTER — Encounter (HOSPITAL_COMMUNITY): Payer: Self-pay

## 2021-09-06 ENCOUNTER — Emergency Department (HOSPITAL_COMMUNITY)
Admission: EM | Admit: 2021-09-06 | Discharge: 2021-09-06 | Disposition: A | Discharge status: Home / Self Care | Payer: Medicare Other | Attending: Emergency Medicine | Admitting: Emergency Medicine

## 2021-09-06 ENCOUNTER — Emergency Department (HOSPITAL_COMMUNITY): Payer: Medicare Other

## 2021-09-06 DIAGNOSIS — R29898 Other symptoms and signs involving the musculoskeletal system: Secondary | ICD-10-CM

## 2021-09-06 DIAGNOSIS — R202 Paresthesia of skin: Secondary | ICD-10-CM | POA: Diagnosis not present

## 2021-09-06 DIAGNOSIS — R200 Anesthesia of skin: Secondary | ICD-10-CM | POA: Diagnosis not present

## 2021-09-06 DIAGNOSIS — M62838 Other muscle spasm: Secondary | ICD-10-CM | POA: Insufficient documentation

## 2021-09-06 DIAGNOSIS — R531 Weakness: Secondary | ICD-10-CM | POA: Diagnosis not present

## 2021-09-06 LAB — CBC WITH DIFFERENTIAL/PLATELET
Abs Immature Granulocytes: 0.07 10*3/uL (ref 0.00–0.07)
Basophils Absolute: 0.1 10*3/uL (ref 0.0–0.1)
Basophils Relative: 1 %
Eosinophils Absolute: 0.4 10*3/uL (ref 0.0–0.5)
Eosinophils Relative: 6 %
HCT: 47 % (ref 39.0–52.0)
Hemoglobin: 15.9 g/dL (ref 13.0–17.0)
Immature Granulocytes: 1 %
Lymphocytes Relative: 21 %
Lymphs Abs: 1.5 10*3/uL (ref 0.7–4.0)
MCH: 30.6 pg (ref 26.0–34.0)
MCHC: 33.8 g/dL (ref 30.0–36.0)
MCV: 90.6 fL (ref 80.0–100.0)
Monocytes Absolute: 0.7 10*3/uL (ref 0.1–1.0)
Monocytes Relative: 10 %
Neutro Abs: 4.2 10*3/uL (ref 1.7–7.7)
Neutrophils Relative %: 61 %
Platelets: 217 10*3/uL (ref 150–400)
RBC: 5.19 MIL/uL (ref 4.22–5.81)
RDW: 14 % (ref 11.5–15.5)
WBC: 6.9 10*3/uL (ref 4.0–10.5)
nRBC: 0 % (ref 0.0–0.2)

## 2021-09-06 LAB — BASIC METABOLIC PANEL
Anion gap: 8 (ref 5–15)
BUN: 13 mg/dL (ref 8–23)
CO2: 20 mmol/L — ABNORMAL LOW (ref 22–32)
Calcium: 8.9 mg/dL (ref 8.9–10.3)
Chloride: 108 mmol/L (ref 98–111)
Creatinine, Ser: 0.98 mg/dL (ref 0.61–1.24)
GFR, Estimated: >60 mL/min (ref >60)
Glucose, Bld: 99 mg/dL (ref 70–99)
Potassium: 3.7 mmol/L (ref 3.5–5.1)
Sodium: 136 mmol/L (ref 135–145)

## 2021-09-06 NOTE — Discharge Instructions (Addendum)
Return here at once should you develop any weakness or numbness to any extremity, facial droop, slurred speech, or any other problems

## 2021-09-06 NOTE — ED Triage Notes (Signed)
Pt arrived via POV, c/o right hand numbness and tingling x1 hour. Right grip strength lesser than left. Denies any other sx. Aox4

## 2021-09-06 NOTE — ED Provider Notes (Signed)
[facility] DEPT Provider Note   CSN: [id number] Arrival date & time: 09/06/21  1143     History  Chief Complaint  Patient presents with   Numbness    Anthony Mclaughlin is a 88 y.o. male.  88 year old male presents with acute onset of right hand paresthesias and numbness which has been gradually improving.  Denies any associated headache, extremity weakness.  Has not had any history of carpal tunnel.  No visual changes.  States that initially he had trouble with finger to thumb opposition which is improved.  States now his sensation has improved throughout his entire right hand but he still has some decreased strength in the right fourth and fifth digits.  No prior history of same.       Home Medications Prior to Admission medications   Medication Sig Start Date End Date Taking? Authorizing Provider  Multiple Vitamin (MULTIVITAMIN) tablet Take 1 tablet by mouth daily.    [provider]      Allergies    Patient has no known allergies.    Review of Systems   Review of Systems  All other systems reviewed and are negative.   Physical Exam Updated Vital Signs BP (!) 153/109 (BP Location: Left Arm)   Pulse 95   Temp 98 F (36.7 C) (Oral)   Resp (!) 21   SpO2 93%  Physical Exam Vitals and nursing note reviewed.  Constitutional:      General: He is not in acute distress.    Appearance: Normal appearance. He is well-developed. He is not toxic-appearing.  HENT:     Head: Normocephalic and atraumatic.  Eyes:     General: Lids are normal.     Conjunctiva/sclera: Conjunctivae normal.     Pupils: Pupils are equal, round, and reactive to light.  Neck:     Thyroid: No thyroid mass.     Trachea: No tracheal deviation.  Cardiovascular:     Rate and Rhythm: Normal rate and regular rhythm.     Heart sounds: Normal heart sounds. No murmur heard.    No gallop.  Pulmonary:     Effort: Pulmonary effort is normal. No respiratory distress.      Breath sounds: Normal breath sounds. No stridor. No decreased breath sounds, wheezing, rhonchi or rales.  Abdominal:     General: There is no distension.     Palpations: Abdomen is soft.     Tenderness: There is no abdominal tenderness. There is no rebound.  Musculoskeletal:        General: No tenderness. Normal range of motion.     Cervical back: Normal range of motion and neck supple.  Skin:    General: Skin is warm and dry.     Findings: No abrasion or rash.  Neurological:     Mental Status: He is alert and oriented to person, place, and time. Mental status is at baseline.     GCS: GCS eye subscore is 4. GCS verbal subscore is 5. GCS motor subscore is 6.     Cranial Nerves: No cranial nerve deficit.     Sensory: No sensory deficit.     Motor: Motor function is intact.     Comments: Decreased strength right fourth and fifth digit.  Normal sensation throughout the upper and lower extremities.  Psychiatric:        Attention and Perception: Attention normal.        Speech: Speech normal.        Behavior: Behavior  normal.     ED Results / Procedures / Treatments   Labs (all labs ordered are listed, but only abnormal results are displayed) Labs Reviewed  CBC WITH DIFFERENTIAL/PLATELET  BASIC METABOLIC PANEL    EKG None  Radiology No results found.  Procedures Procedures    Medications Ordered in ED Medications - No data to display  ED Course/ Medical Decision Making/ A&P                           Medical Decision Making Amount and/or Complexity of Data Reviewed Labs: ordered. Radiology: ordered.   Patient's head CT per my interpretation showed no acute findings.  Patient's CBC and electrolytes with normal limits.  Patient's hand re-examined and and right third and fourth digit muscle spasms have resolved.  Suspect muscular etiology as opposed to central cause.  Discussed return precautions with the patient and his wife.  They are agreeable.  Considered other  etiologies such as carpal tunnel but feel less likely.  Considered stroke but feel less likely.        Final Clinical Impression(s) / ED Diagnoses Final diagnoses:  None    Rx / DC Orders ED Discharge Orders     None         Lacretia Leigh, MD 09/06/21 1418

## 2021-11-16 DIAGNOSIS — Z23 Encounter for immunization: Secondary | ICD-10-CM | POA: Diagnosis not present

## 2022-03-06 DIAGNOSIS — I10 Essential (primary) hypertension: Secondary | ICD-10-CM | POA: Diagnosis not present

## 2022-03-06 DIAGNOSIS — R2681 Unsteadiness on feet: Secondary | ICD-10-CM | POA: Diagnosis not present

## 2022-03-06 DIAGNOSIS — R002 Palpitations: Secondary | ICD-10-CM | POA: Diagnosis not present

## 2022-07-03 DIAGNOSIS — Z961 Presence of intraocular lens: Secondary | ICD-10-CM | POA: Diagnosis not present

## 2022-07-03 DIAGNOSIS — Z125 Encounter for screening for malignant neoplasm of prostate: Secondary | ICD-10-CM | POA: Diagnosis not present

## 2022-07-03 DIAGNOSIS — I10 Essential (primary) hypertension: Secondary | ICD-10-CM | POA: Diagnosis not present

## 2022-07-03 DIAGNOSIS — E785 Hyperlipidemia, unspecified: Secondary | ICD-10-CM | POA: Diagnosis not present

## 2022-07-03 DIAGNOSIS — H35371 Puckering of macula, right eye: Secondary | ICD-10-CM | POA: Diagnosis not present

## 2022-07-03 DIAGNOSIS — R7989 Other specified abnormal findings of blood chemistry: Secondary | ICD-10-CM | POA: Diagnosis not present

## 2022-07-03 DIAGNOSIS — H524 Presbyopia: Secondary | ICD-10-CM | POA: Diagnosis not present

## 2022-07-10 DIAGNOSIS — R1313 Dysphagia, pharyngeal phase: Secondary | ICD-10-CM | POA: Diagnosis not present

## 2022-07-10 DIAGNOSIS — R2681 Unsteadiness on feet: Secondary | ICD-10-CM | POA: Diagnosis not present

## 2022-07-10 DIAGNOSIS — E785 Hyperlipidemia, unspecified: Secondary | ICD-10-CM | POA: Diagnosis not present

## 2022-07-10 DIAGNOSIS — L989 Disorder of the skin and subcutaneous tissue, unspecified: Secondary | ICD-10-CM | POA: Diagnosis not present

## 2022-07-10 DIAGNOSIS — I10 Essential (primary) hypertension: Secondary | ICD-10-CM | POA: Diagnosis not present

## 2022-07-10 DIAGNOSIS — Z1339 Encounter for screening examination for other mental health and behavioral disorders: Secondary | ICD-10-CM | POA: Diagnosis not present

## 2022-07-10 DIAGNOSIS — K222 Esophageal obstruction: Secondary | ICD-10-CM | POA: Diagnosis not present

## 2022-07-10 DIAGNOSIS — R82998 Other abnormal findings in urine: Secondary | ICD-10-CM | POA: Diagnosis not present

## 2022-07-10 DIAGNOSIS — Z1331 Encounter for screening for depression: Secondary | ICD-10-CM | POA: Diagnosis not present

## 2022-07-10 DIAGNOSIS — Z0000 Encounter for general adult medical examination without abnormal findings: Secondary | ICD-10-CM | POA: Diagnosis not present

## 2022-10-23 DIAGNOSIS — D045 Carcinoma in situ of skin of trunk: Secondary | ICD-10-CM | POA: Diagnosis not present

## 2022-10-23 DIAGNOSIS — D485 Neoplasm of uncertain behavior of skin: Secondary | ICD-10-CM | POA: Diagnosis not present

## 2022-10-23 DIAGNOSIS — L723 Sebaceous cyst: Secondary | ICD-10-CM | POA: Diagnosis not present

## 2022-11-08 DIAGNOSIS — D045 Carcinoma in situ of skin of trunk: Secondary | ICD-10-CM | POA: Diagnosis not present

## 2022-11-10 ENCOUNTER — Emergency Department (HOSPITAL_BASED_OUTPATIENT_CLINIC_OR_DEPARTMENT_OTHER)
Admission: EM | Admit: 2022-11-10 | Discharge: 2022-11-10 | Disposition: A | Discharge status: Home / Self Care | Payer: Medicare Other | Attending: Emergency Medicine | Admitting: Emergency Medicine

## 2022-11-10 ENCOUNTER — Emergency Department (HOSPITAL_BASED_OUTPATIENT_CLINIC_OR_DEPARTMENT_OTHER): Payer: Medicare Other

## 2022-11-10 ENCOUNTER — Other Ambulatory Visit: Payer: Self-pay

## 2022-11-10 ENCOUNTER — Encounter (HOSPITAL_BASED_OUTPATIENT_CLINIC_OR_DEPARTMENT_OTHER): Payer: Self-pay

## 2022-11-10 DIAGNOSIS — Z1152 Encounter for screening for COVID-19: Secondary | ICD-10-CM | POA: Diagnosis not present

## 2022-11-10 DIAGNOSIS — R519 Headache, unspecified: Secondary | ICD-10-CM | POA: Diagnosis not present

## 2022-11-10 DIAGNOSIS — R5383 Other fatigue: Secondary | ICD-10-CM | POA: Insufficient documentation

## 2022-11-10 DIAGNOSIS — Z79899 Other long term (current) drug therapy: Secondary | ICD-10-CM | POA: Diagnosis not present

## 2022-11-10 DIAGNOSIS — I10 Essential (primary) hypertension: Secondary | ICD-10-CM | POA: Diagnosis not present

## 2022-11-10 HISTORY — DX: Essential (primary) hypertension: I10

## 2022-11-10 LAB — COMPREHENSIVE METABOLIC PANEL
ALT: 18 U/L (ref 0–44)
AST: 21 U/L (ref 15–41)
Albumin: 4.1 g/dL (ref 3.5–5.0)
Alkaline Phosphatase: 63 U/L (ref 38–126)
Anion gap: 8 (ref 5–15)
BUN: 10 mg/dL (ref 8–23)
CO2: 23 mmol/L (ref 22–32)
Calcium: 8.9 mg/dL (ref 8.9–10.3)
Chloride: 107 mmol/L (ref 98–111)
Creatinine, Ser: 0.9 mg/dL (ref 0.61–1.24)
GFR, Estimated: >60 mL/min (ref >60)
Glucose, Bld: 98 mg/dL (ref 70–99)
Potassium: 4 mmol/L (ref 3.5–5.1)
Sodium: 138 mmol/L (ref 135–145)
Total Bilirubin: 1 mg/dL (ref 0.3–1.2)
Total Protein: 7.1 g/dL (ref 6.5–8.1)

## 2022-11-10 LAB — URINALYSIS, ROUTINE W REFLEX MICROSCOPIC
Bilirubin Urine: NEGATIVE
Glucose, UA: NEGATIVE mg/dL
Hgb urine dipstick: NEGATIVE
Ketones, ur: NEGATIVE mg/dL
Leukocytes,Ua: NEGATIVE
Nitrite: NEGATIVE
Protein, ur: NEGATIVE mg/dL
Specific Gravity, Urine: 1.009 (ref 1.005–1.030)
pH: 7 (ref 5.0–8.0)

## 2022-11-10 LAB — CBC WITH DIFFERENTIAL/PLATELET
Abs Immature Granulocytes: 0.04 10*3/uL (ref 0.00–0.07)
Basophils Absolute: 0 10*3/uL (ref 0.0–0.1)
Basophils Relative: 0 %
Eosinophils Absolute: 0.4 10*3/uL (ref 0.0–0.5)
Eosinophils Relative: 6 %
HCT: 46.2 % (ref 39.0–52.0)
Hemoglobin: 16 g/dL (ref 13.0–17.0)
Immature Granulocytes: 1 %
Lymphocytes Relative: 20 %
Lymphs Abs: 1.4 10*3/uL (ref 0.7–4.0)
MCH: 30.8 pg (ref 26.0–34.0)
MCHC: 34.6 g/dL (ref 30.0–36.0)
MCV: 88.8 fL (ref 80.0–100.0)
Monocytes Absolute: 0.7 10*3/uL (ref 0.1–1.0)
Monocytes Relative: 10 %
Neutro Abs: 4.5 10*3/uL (ref 1.7–7.7)
Neutrophils Relative %: 63 %
Platelets: 219 10*3/uL (ref 150–400)
RBC: 5.2 MIL/uL (ref 4.22–5.81)
RDW: 13.5 % (ref 11.5–15.5)
WBC: 7 10*3/uL (ref 4.0–10.5)
nRBC: 0 % (ref 0.0–0.2)

## 2022-11-10 LAB — TROPONIN I (HIGH SENSITIVITY): Troponin I (High Sensitivity): 4 ng/L (ref <18)

## 2022-11-10 LAB — SARS CORONAVIRUS 2 BY RT PCR: SARS Coronavirus 2 by RT PCR: NEGATIVE

## 2022-11-10 MED ORDER — AMLODIPINE BESYLATE 5 MG PO TABS: 5.0000 mg | ORAL_TABLET | Freq: Every day | ORAL | 0 refills | Status: AC

## 2022-11-10 NOTE — ED Triage Notes (Signed)
States woke up with headache and feeling lethargic.  States BP 171/107

## 2022-11-10 NOTE — ED Notes (Signed)
Patient verbalizes understanding of discharge instructions. Opportunity for questioning and answers were provided. Patient discharged from ED. Iv removed x1

## 2022-11-10 NOTE — ED Provider Notes (Signed)
[facility] EMERGENCY DEPARTMENT AT [facility] Provider Note   CSN: [id number] Arrival date & time: 11/10/22  6578     History  Chief Complaint  Patient presents with   Hypertension    Anthony Mclaughlin is a 89 y.o. male.  HPI     89yo male with history of hypertension presents with concern for hypertension and fatigue.  Just not feeling good this morning, "something not right", had a slight headache on arrival here.  Just woke up feeling this way. Blood pressures were going  up at home 171/107, started out 168, HR was up a little-usually around 60 and was around 80  Denies numbness, weakness, difficulty talking or walking, visual changes or facial droop. No room spinning or lightheadedness. No chest pain or dyspnea, no jaw, arm, back pain.  No nausea.  No vomiting, no diarrhea, black or bloody stools  No cough No fever No urinary symptoms  No change in medications 5 months ago felt this way and BP was high and started medicine for hbp then 12.5mg  metoprolol XL a day (took 25mg  today)     Past Medical History:  Diagnosis Date   Frequency of urination    History of bleeding peptic ulcer    1997  s/p  surgery   Hypertension    Phimosis    Urgency of urination    Wears glasses    Wears partial dentures    upper     Home Medications Prior to Admission medications   Medication Sig Start Date End Date Taking? Authorizing Provider  amLODipine (NORVASC) 5 MG tablet Take 1 tablet (5 mg total) by mouth daily. 11/10/22 12/10/22 Yes Alvira Monday, MD  Multiple Vitamin (MULTIVITAMIN) tablet Take 1 tablet by mouth daily.    [provider]      Allergies    Patient has no known allergies.    Review of Systems   Review of Systems  Physical Exam Updated Vital Signs BP (!) 151/86 (BP Location: Right Arm)   Pulse 67   Temp (!) 97.4 F (36.3 C) (Oral)   Resp 17   Ht 6' 2.5" (1.892 m)   Wt 99.8 kg   SpO2 96%   BMI 27.87 kg/m  Physical Exam Vitals  and nursing note reviewed.  Constitutional:      General: He is not in acute distress.    Appearance: Normal appearance. He is well-developed. He is not ill-appearing or diaphoretic.  HENT:     Head: Normocephalic and atraumatic.  Eyes:     General: No visual field deficit.    Extraocular Movements: Extraocular movements intact.     Conjunctiva/sclera: Conjunctivae normal.     Pupils: Pupils are equal, round, and reactive to light.  Cardiovascular:     Rate and Rhythm: Normal rate and regular rhythm.     Pulses: Normal pulses.     Heart sounds: Normal heart sounds. No murmur heard.    No friction rub. No gallop.  Pulmonary:     Effort: Pulmonary effort is normal. No respiratory distress.     Breath sounds: Normal breath sounds. No wheezing or rales.  Abdominal:     General: There is no distension.     Palpations: Abdomen is soft.     Tenderness: There is no abdominal tenderness. There is no guarding.  Musculoskeletal:        General: No swelling or tenderness.     Cervical back: Normal range of motion.  Skin:    General:  Skin is warm and dry.     Findings: No erythema or rash.  Neurological:     General: No focal deficit present.     Mental Status: He is alert and oriented to person, place, and time.     GCS: GCS eye subscore is 4. GCS verbal subscore is 5. GCS motor subscore is 6.     Cranial Nerves: No cranial nerve deficit, dysarthria or facial asymmetry.     Sensory: No sensory deficit.     Motor: No weakness or tremor.     Coordination: Coordination normal. Finger-Nose-Finger Test normal.     Gait: Gait normal.     ED Results / Procedures / Treatments   Labs (all labs ordered are listed, but only abnormal results are displayed) Labs Reviewed  URINALYSIS, ROUTINE W REFLEX MICROSCOPIC - Abnormal; Notable for the following components:      Result Value   Color, Urine COLORLESS (*)    All other components within normal limits  SARS CORONAVIRUS 2 BY RT PCR  CBC WITH  DIFFERENTIAL/PLATELET  COMPREHENSIVE METABOLIC PANEL  TROPONIN I (HIGH SENSITIVITY)  TROPONIN I (HIGH SENSITIVITY)    EKG EKG Interpretation Date/Time:  Saturday November 10 2022 09:33:16 EDT Ventricular Rate:  78 PR Interval:  200 QRS Duration:  152 QT Interval:  430 QTC Calculation: 490 R Axis:   61  Text Interpretation: Sinus rhythm Right bundle branch block No previous ECGs available Confirmed by Alvira Monday ([id number]) on 11/10/2022 9:35:49 AM  Radiology CT Head Wo Contrast  Result Date: 11/10/2022 CLINICAL DATA:  Hypertension, woke up with headache. EXAM: CT HEAD WITHOUT CONTRAST TECHNIQUE: Contiguous axial images were obtained from the base of the skull through the vertex without intravenous contrast. RADIATION DOSE REDUCTION: This exam was performed according to the departmental dose-optimization program which includes automated exposure control, adjustment of the mA and/or kV according to patient size and/or use of iterative reconstruction technique. COMPARISON:  Head CT 09/06/2021 FINDINGS: Brain: No evidence of acute infarction, hemorrhage, hydrocephalus, or masslike finding. CSF density expansion along the frontal lobes is stable, a small hygroma is considered but on coronal reformats there is no clear displacement of subarachnoid space vessels. Less than typical low-density in the cerebral white matter. Vascular: Vessel tortuosity in keeping with age. No hyperdense vessel or unexpected calcification. Skull: Negative for fracture or lesion. Sinuses/Orbits: No acute finding. IMPRESSION: No acute finding or change from 09/06/2021. Electronically Signed   By: Tiburcio Pea M.D.   On: 11/10/2022 10:22    Procedures Procedures    Medications Ordered in ED Medications - No data to display  ED Course/ Medical Decision Making/ A&P                                  89yo male with history of hypertension presents with concern for hypertension and fatigue.  Differential  diagnosis includes anemia, electrolyte abnormality, cardiac abnormality, infection, hypothyroidism, other toxic/metabolic abnormalities.  CT head completed given headache hypertension, feeling not self, shows no evidence of ICH. Headache improved no neurologic abnormalities on exam and doubt CVA, hypertensive encephalopathy. No cough, dyspnea, chest pain doubt pneumonia.  Urinalysis shows no evidence of infection. CBC showed no sign of anemia, no leukocytosis.  Electrolytes WNL.  COVID testing negative.EKG was not changed from prior, patient does not have chest pain, and troponin negative.  Consider other cardiac arrhythmia although no palpitations.  No sign of hypertensive emergency however  it is possible his feeling of fatigue/being off may be related to blood pressure as he has had these similar symptoms in the past.    Given rx for amlodipine 5mg , recommend close PCP follow up and discussed return precautions.         Final Clinical Impression(s) / ED Diagnoses Final diagnoses:  Other fatigue  Hypertension, unspecified type    Rx / DC Orders ED Discharge Orders          Ordered    amLODipine (NORVASC) 5 MG tablet  Daily        11/10/22 1148              Alvira Monday, MD 11/10/22 2149

## 2022-11-14 DIAGNOSIS — I10 Essential (primary) hypertension: Secondary | ICD-10-CM | POA: Diagnosis not present

## 2022-12-14 ENCOUNTER — Telehealth: Payer: Self-pay

## 2022-12-14 NOTE — Telephone Encounter (Signed)
Transition Care Management Follow-up Telephone Call Date of discharge and from where: [facility] 9/21 How have you been since you were released from the hospital? Doing great and following up with providers Any questions or concerns? No  Items Reviewed: Did the pt receive and understand the discharge instructions provided? Yes Medications obtained and verified? Yes  Other? No  Any new allergies since your discharge? No  Dietary orders reviewed? No Do you have support at home? Yes    Follow up appointments reviewed:  PCP Hospital f/u appt confirmed? Yes  Scheduled to see  on  @ . Specialist Hospital f/u appt confirmed? No  Scheduled to see  on  @ . Are transportation arrangements needed? No  If their condition worsens, is the pt aware to call PCP or go to the Emergency Dept.? Yes Was the patient provided with contact information for the PCP's office or ED? Yes Was to pt encouraged to call back with questions or concerns? Yes

## 2022-12-18 DIAGNOSIS — Z23 Encounter for immunization: Secondary | ICD-10-CM | POA: Diagnosis not present

## 2022-12-31 DIAGNOSIS — I10 Essential (primary) hypertension: Secondary | ICD-10-CM | POA: Diagnosis not present

## 2022-12-31 DIAGNOSIS — F5104 Psychophysiologic insomnia: Secondary | ICD-10-CM | POA: Diagnosis not present

## 2022-12-31 DIAGNOSIS — E785 Hyperlipidemia, unspecified: Secondary | ICD-10-CM | POA: Diagnosis not present

## 2022-12-31 DIAGNOSIS — K222 Esophageal obstruction: Secondary | ICD-10-CM | POA: Diagnosis not present

## 2023-05-24 DIAGNOSIS — I10 Essential (primary) hypertension: Secondary | ICD-10-CM | POA: Diagnosis not present

## 2023-05-24 DIAGNOSIS — K222 Esophageal obstruction: Secondary | ICD-10-CM | POA: Diagnosis not present

## 2023-05-24 DIAGNOSIS — R55 Syncope and collapse: Secondary | ICD-10-CM | POA: Diagnosis not present

## 2023-05-26 ENCOUNTER — Encounter: Payer: Self-pay | Admitting: Cardiology

## 2023-05-26 NOTE — Progress Notes (Unsigned)
 Cardiology Office Note   Date:  05/27/2023   ID:  Jordi, Lacko 1933-10-14, MRN [id number]  PCP:  Garlan Fillers, MD  Cardiologist:   None Referring:  Garlan Fillers, MD  Chief Complaint  Patient presents with   Dizziness      History of Present Illness: Anthony Mclaughlin is a 89 y.o. male who presents for evaluation of dizziness.  He had an episode a week ago.  This was dizziness.  His legs felt heavy.  He felt hot.  He was walking in his home at [facility] and had to ease himself down to the ground.  He never lost consciousness.  He was down for about 20 minutes before he felt like he could get himself up.  He did not call the nurse or EMS.  He has not had this before.  He has not had this since.  He did not really feel palpitations.  He was not having any chest pressure, neck or arm discomfort.  He saw his primary provider who did orthostatics in the office and he was hypertensive and not orthostatic.  He has a blood pressure monitor that has been ordered for 24 hours.  He has had a lot of manipulations to his blood pressure medicines and he says they have been really well-controlled at home under the management of Garlan Fillers, MD   Past Medical History:  Diagnosis Date   History of bleeding peptic ulcer    1997  s/p  surgery   Hypertension    Phimosis    Urgency of urination    Wears glasses    Wears partial dentures    upper    Past Surgical History:  Procedure Laterality Date   CATARACT EXTRACTION W/ INTRAOCULAR LENS  IMPLANT, BILATERAL  2015   CIRCUMCISION N/A 06/04/2016   Procedure: CIRCUMCISION ADULT;  Surgeon: Ihor Gully, MD;  Location: [facility];  Service: Urology;  Laterality: N/A;   ESOPHAGOGASTRODUODENOSCOPY Left 11/03/2017   Procedure: ESOPHAGOGASTRODUODENOSCOPY (EGD);  Surgeon: Shellia Cleverly, DO;  Location: [facility];  Service: Gastroenterology;  Laterality: Left;   GASTROTOMY  1997   bleeding peptic ulcer      Current Outpatient Medications  Medication Sig Dispense Refill   aspirin EC (ECOTRIN LOW STRENGTH) 81 MG tablet Take 81 mg by mouth daily.     metoprolol succinate (TOPROL-XL) 25 MG 24 hr tablet Take 12.5 mg by mouth 2 (two) times daily.     Multiple Vitamin (MULTIVITAMIN) tablet Take 1 tablet by mouth daily.     rosuvastatin (CRESTOR) 10 MG tablet Take 10 mg by mouth daily.     amLODipine (NORVASC) 5 MG tablet Take 1 tablet (5 mg total) by mouth daily. 30 tablet 0   No current facility-administered medications for this visit.    Allergies:   Patient has no known allergies.    Social History:  The patient  reports that he quit smoking about 51 years ago. His smoking use included cigarettes. He started smoking about 52 years ago. He has never used smokeless tobacco. He reports that he does not drink alcohol and does not use drugs.   Family History:  The patient's family history includes Alzheimer's disease (age of onset: 66) in his brother; Lung cancer (age of onset: 51) in his mother.    ROS:  Please see the history of present illness.   Otherwise, review of systems are positive for none.   All other systems are reviewed  and negative.    PHYSICAL EXAM: VS:  BP (!) 148/80 (BP Location: Left Arm, Patient Position: Sitting, Cuff Size: Normal)   Pulse 73   Ht 6\' 3"  (1.905 m)   Wt 202 lb 12.8 oz (92 kg)   SpO2 93%   BMI 25.35 kg/m  , BMI Body mass index is 25.35 kg/m. GENERAL:  Well appearing HEENT:  Pupils equal round and reactive, fundi not visualized, oral mucosa unremarkable NECK:  No jugular venous distention, waveform within normal limits, carotid upstroke brisk and symmetric, positive bruits, no thyromegaly LYMPHATICS:  No cervical, inguinal adenopathy LUNGS:  Clear to auscultation bilaterally BACK:  No CVA tenderness CHEST:  Unremarkable HEART:  PMI not displaced or sustained,S1 and S2 within normal limits, no S3, no S4, no clicks, no rubs, no murmurs ABD:  Flat,  positive bowel sounds normal in frequency in pitch, no bruits, no rebound, no guarding, no midline pulsatile mass, no hepatomegaly, no splenomegaly EXT:  2 plus pulses throughout, no edema, no cyanosis no clubbing SKIN:  No rashes no nodules NEURO:  Cranial nerves II through XII grossly intact, motor grossly intact throughout PSYCH:  Cognitively intact, oriented to person place and time    EKG:        Recent Labs: 11/10/2022: ALT 18; BUN 10; Creatinine, Ser 0.90; Hemoglobin 16.0; Platelets 219; Potassium 4.0; Sodium 138    Lipid Panel No results found for: "CHOL", "TRIG", "HDL", "CHOLHDL", "VLDL", "LDLCALC", "LDLDIRECT"    Wt Readings from Last 3 Encounters:  05/27/23 202 lb 12.8 oz (92 kg)  11/10/22 220 lb (99.8 kg)  10/12/20 215 lb 4 oz (97.6 kg)      Other studies Reviewed: Additional studies/ records that were reviewed today include: Primary care office records. Review of the above records demonstrates:  Please see elsewhere in the note.     ASSESSMENT AND PLAN:  Dizziness: The etiology is not clear.  He does have a conduction disturbance as described above.  I am going to have him wear a 2-week monitor to look for any symptomatic bradycardia arrhythmias.  He will also get carotid Doppler.  If this is unrevealing and he has no symptoms I would not suggest further cardiovascular testing.  His exam is unremarkable.  He is otherwise been well.  Hypertension: His blood pressure is being managed by his primary provider and I be happy to review the blood pressure monitor results but at this point will not make a med change as this is followed closely by his primary provider.  I do see that he is up-to-date with blood work.   Hyperlipidemia:   I did note that he is LDL was 170 and I talked about this.  I have suggested at least a diet to try to get this down and he could consider statin at this level although he has done well without medications all these years.  There is no  screening testing that I would do to determine goals of therapy.  I told him to have a conversation with Garlan Fillers, MD   Current medicines are reviewed at length with the patient today.  The patient does not have concerns regarding medicines.  The following changes have been made:  no change  Labs/ tests ordered today include:   Orders Placed This Encounter  Procedures   LONG TERM MONITOR (3-14 DAYS)   VAS US CAROTID     Disposition:   FU with with me as needed.     Signed, Fayrene Fearing  Towanna Avery, MD  05/27/2023 11:41 AM    [facility]

## 2023-05-27 ENCOUNTER — Ambulatory Visit (INDEPENDENT_AMBULATORY_CARE_PROVIDER_SITE_OTHER)

## 2023-05-27 ENCOUNTER — Encounter: Payer: Self-pay | Admitting: Cardiology

## 2023-05-27 ENCOUNTER — Ambulatory Visit: Attending: Cardiology | Admitting: Cardiology

## 2023-05-27 VITALS — BP 148/80 | HR 73 | Ht 75.0 in | Wt 202.8 lb

## 2023-05-27 DIAGNOSIS — Z136 Encounter for screening for cardiovascular disorders: Secondary | ICD-10-CM | POA: Insufficient documentation

## 2023-05-27 DIAGNOSIS — R001 Bradycardia, unspecified: Secondary | ICD-10-CM | POA: Diagnosis present

## 2023-05-27 DIAGNOSIS — R42 Dizziness and giddiness: Secondary | ICD-10-CM | POA: Insufficient documentation

## 2023-05-27 DIAGNOSIS — R0989 Other specified symptoms and signs involving the circulatory and respiratory systems: Secondary | ICD-10-CM | POA: Diagnosis present

## 2023-05-27 DIAGNOSIS — R9431 Abnormal electrocardiogram [ECG] [EKG]: Secondary | ICD-10-CM | POA: Insufficient documentation

## 2023-05-27 NOTE — Progress Notes (Unsigned)
 Enrolled for [employer] to mail a ZIO XT long term holter monitor to the patients address on file.

## 2023-05-27 NOTE — Patient Instructions (Signed)
 Medication Instructions:  NO CHANGES  *If you need a refill on your cardiac medications before your next appointment, please call your pharmacy*  Testing/Procedures: Your physician has requested that you have a carotid duplex. This test is an ultrasound of the carotid arteries in your neck. It looks at blood flow through these arteries that supply the brain with blood. Allow one hour for this exam. There are no restrictions or special instructions.  ZIO AT Long term monitor-Live Telemetry  Your physician has requested you wear a ZIO patch monitor for 14 days.  This is a single patch monitor. [employer] supplies one patch monitor per enrollment. Additional  stickers are not available.  Please do not apply patch if you will be having a Nuclear Stress Test, Echocardiogram, Cardiac CT, MRI,  or Chest Xray during the period you would be wearing the monitor. The patch cannot be worn during  these tests. You cannot remove and re-apply the ZIO AT patch monitor.  Your ZIO patch monitor will be mailed 3 day USPS to your address on file. It may take 3-5 days to  receive your monitor after you have been enrolled.  Once you have received your monitor, please review the enclosed instructions. Your monitor has  already been registered assigning a specific monitor serial # to you.   Billing and Patient Assistance Program information  Meredeth Ide has been supplied with any insurance information on record for billing. [employer] offers a sliding scale Patient Assistance Program for patients without insurance, or whose  insurance does not completely cover the cost of the ZIO patch monitor. You must apply for the  Patient Assistance Program to qualify for the discounted rate. To apply, call [employer] at [phone],  select option 4, select option 2 , ask to apply for the Patient Assistance Program, (you can request an  interpreter if needed). [employer] will ask your household income and how many people are in your   household. [employer] will quote your out-of-pocket cost based on this information. They will also be able  to set up a 12 month interest free payment plan if needed.  Applying the monitor   Shave hair from upper left chest.  Hold the abrader disc by orange tab. Rub the abrader in 40 strokes over left upper chest as indicated in  your monitor instructions.  Clean area with 4 enclosed alcohol pads. Use all pads to ensure the area is cleaned thoroughly. Let  dry.  Apply patch as indicated in monitor instructions. Patch will be placed under collarbone on left side of  chest with arrow pointing upward.  Rub patch adhesive wings for 2 minutes. Remove the white label marked "1". Remove the white label  marked "2". Rub patch adhesive wings for 2 additional minutes.  While looking in a mirror, press and release button in center of patch. A small green light will flash 3-4  times. This will be your only indicator that the monitor has been turned on.  Do not shower for the first 24 hours. You may shower after the first 24 hours.  Press the button if you feel a symptom. You will hear a small click. Record Date, Time and Symptom in  the Patient Log.   Starting the Gateway  In your kit there is a Audiological scientist box the size of a cellphone. This is Buyer, retail. It transmits all your  recorded data to [facility]. This box must always stay within 10 feet of you. Open the box and push the *  button. There will be a light that blinks orange and then green a few times. When the light stops  blinking, the Gateway is connected to the ZIO patch. Call [employer] at [phone] to confirm your monitor is transmitting.  Returning your monitor  Remove your patch and place it inside the Gateway. In the lower half of the Gateway there is a white  bag with prepaid postage on it. Place Gateway in bag and seal. Mail package back to [place] as soon as  possible. Your physician should have your final report approximately 7  days after you have mailed back  your monitor. Call [facility] Customer Care at [phone] if you have questions regarding your ZIO AT  patch monitor. Call them immediately if you see an orange light blinking on your monitor.  If your monitor falls off in less than 4 days, contact our Monitor department at [phone]. If your  monitor becomes loose or falls off after 4 days call [employer] at [phone] for suggestions on  securing your monitor   Follow-Up: At [facility], you and your health needs are our priority.  As part of our continuing mission to provide you with exceptional heart care, our providers are all part of one team.  This team includes your primary Cardiologist (physician) and Advanced Practice Providers or APPs (Physician Assistants and Nurse Practitioners) who all work together to provide you with the care you need, when you need it.  Your next appointment:   AS NEEDED with Dr. Antoine Poche -- pending test results      1st Floor: - Lobby - Registration  - Pharmacy  - Lab - Cafe  2nd Floor: - PV Lab - Diagnostic Testing (echo, CT, nuclear med)  3rd Floor: - Vacant  4th Floor: - TCTS (cardiothoracic surgery) - AFib Clinic - Structural Heart Clinic - Vascular Surgery  - Vascular Ultrasound  5th Floor: - HeartCare Cardiology (general and EP) - Clinical Pharmacy for coumadin, hypertension, lipid, weight-loss medications, and med management appointments    Valet parking services will be available as well.

## 2023-06-11 DIAGNOSIS — I10 Essential (primary) hypertension: Secondary | ICD-10-CM | POA: Diagnosis not present

## 2023-06-17 DIAGNOSIS — I10 Essential (primary) hypertension: Secondary | ICD-10-CM | POA: Diagnosis not present

## 2023-06-21 DIAGNOSIS — R001 Bradycardia, unspecified: Secondary | ICD-10-CM | POA: Diagnosis not present

## 2023-06-21 DIAGNOSIS — R42 Dizziness and giddiness: Secondary | ICD-10-CM | POA: Diagnosis not present

## 2023-06-21 IMAGING — RF DG ESOPHAGUS
10 of 11 series · 14 of 24 positions shown · non-contrast
Comparison: None.

CLINICAL DATA: Pharyngeal dysphagia with solid food sticking in the
lower chest. History of hiatal hernia.

EXAM:
ESOPHOGRAM / BARIUM SWALLOW / BARIUM TABLET STUDY
TECHNIQUE: Combined double contrast and single contrast examination performed
using effervescent crystals, thick barium liquid, and thin barium
liquid. The patient was observed with fluoroscopy swallowing a 13 mm
barium sulphate tablet.
FLUOROSCOPY TIME:  Fluoroscopy Time:  3 minutes 24 seconds
Radiation Exposure Index (if provided by the fluoroscopic device):
26 mGy
Number of Acquired Spot Images: 9

[Series 1: sequence · 2 of 27 frames shown (1 of 8)]
[frame 5/27]
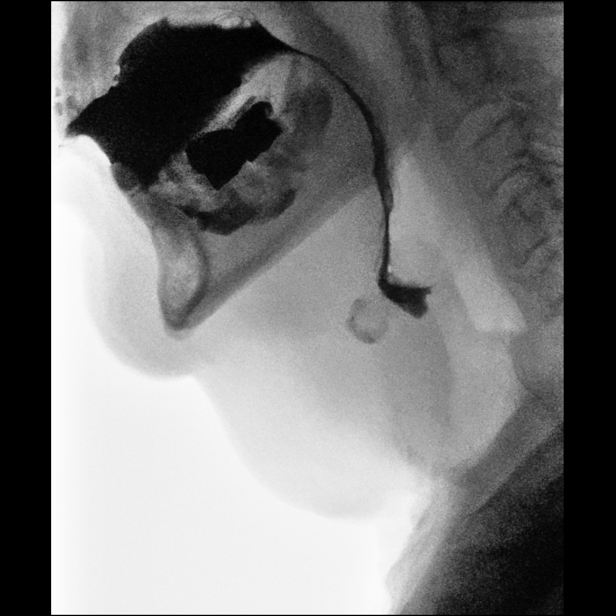
[frame 23/27]
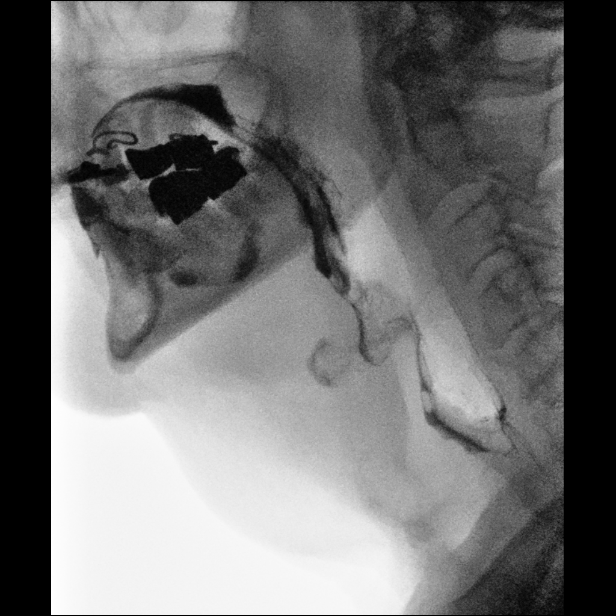

[Series 3: sequence · 1 of 14 frames shown (2 of 8)]
[frame 8/14]
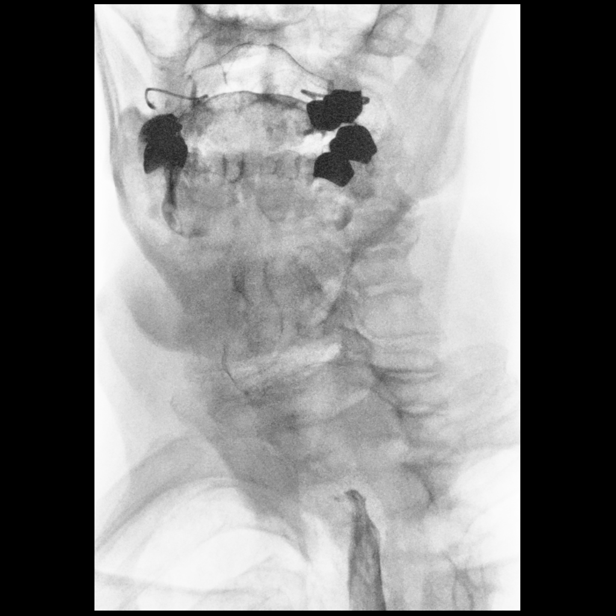

[Series 4: one shot · 0.15mm/px · 2 of 6 slices shown (1 of 2)]
[im 2/6]
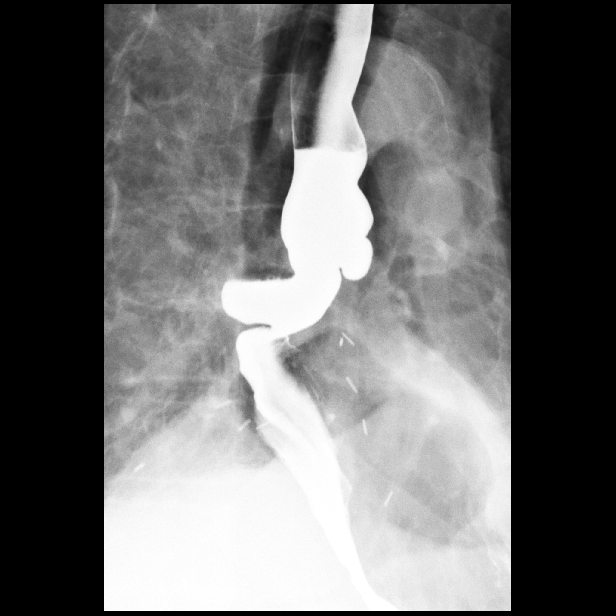
[im 4/6]
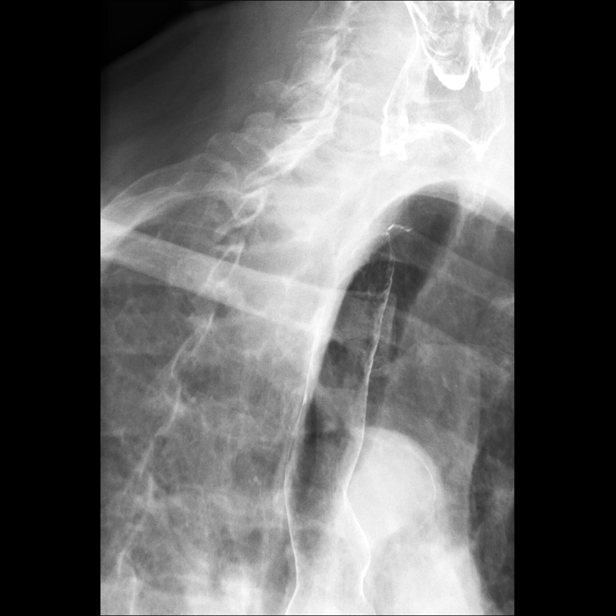

[Series 5: sequence · 1 of 50 frames shown (3 of 8)]
[frame 24/50]
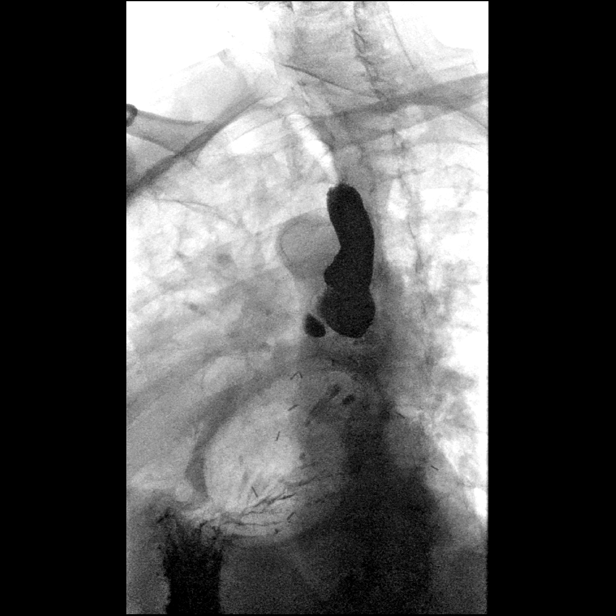

[Series 6: sequence · 2 of 99 frames shown (4 of 8)]
[frame 15/99]
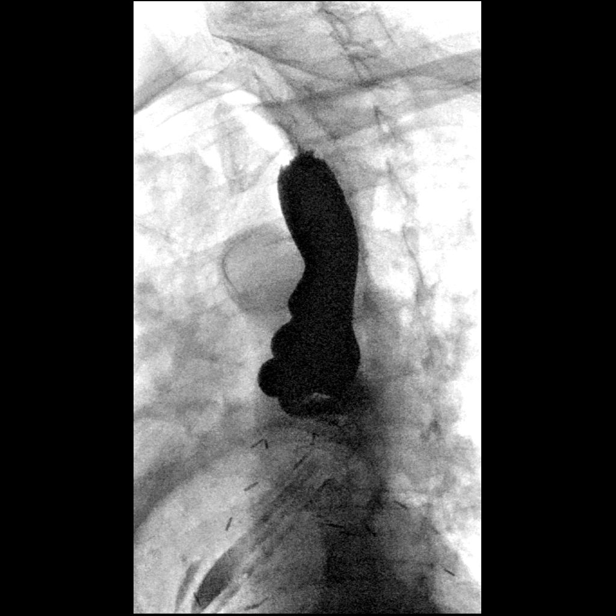
[frame 79/99]
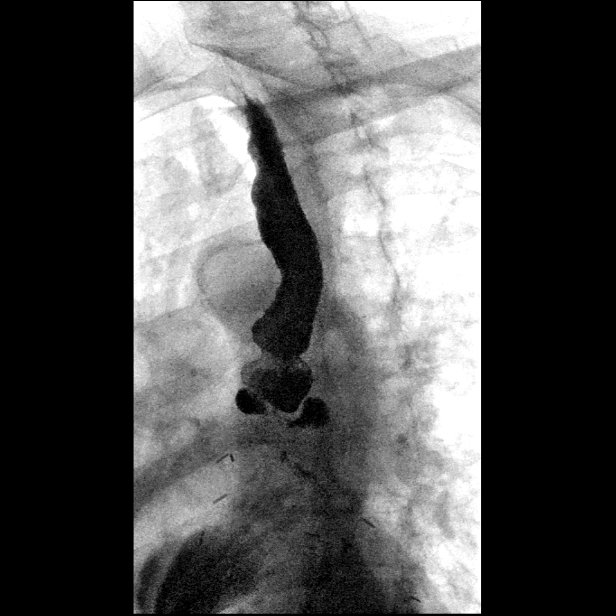

[Series 8: sequence · 1 of 33 frames shown (5 of 8)]
[frame 5/33]
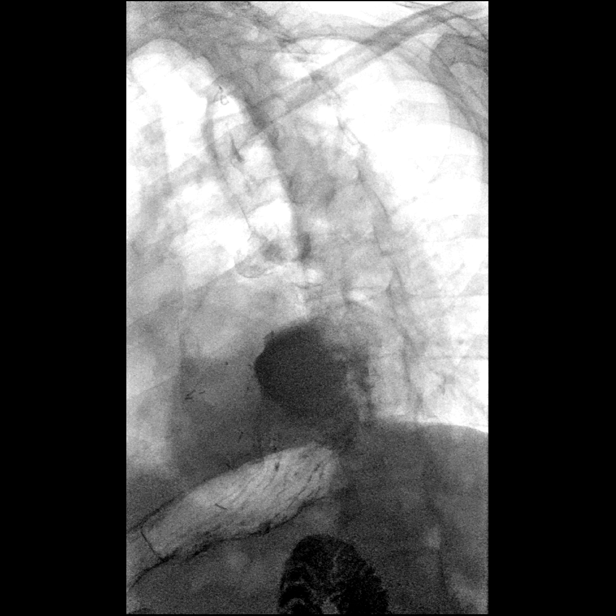

[Series 9: sequence · 2 of 57 frames shown (6 of 8)]
[frame 9/57]
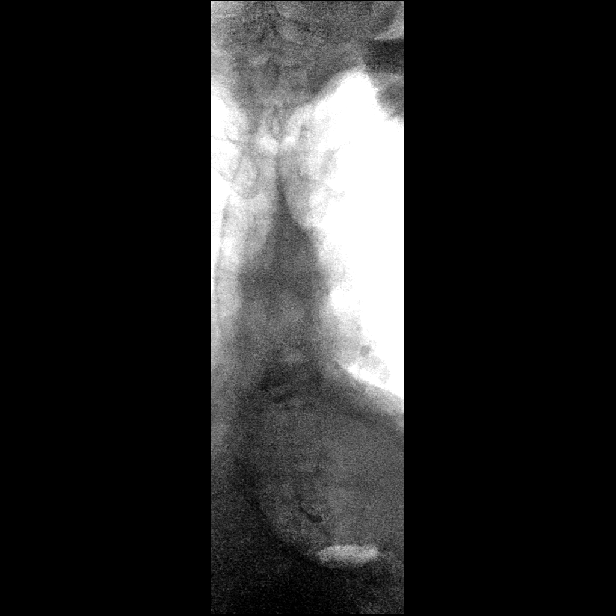
[frame 49/57]
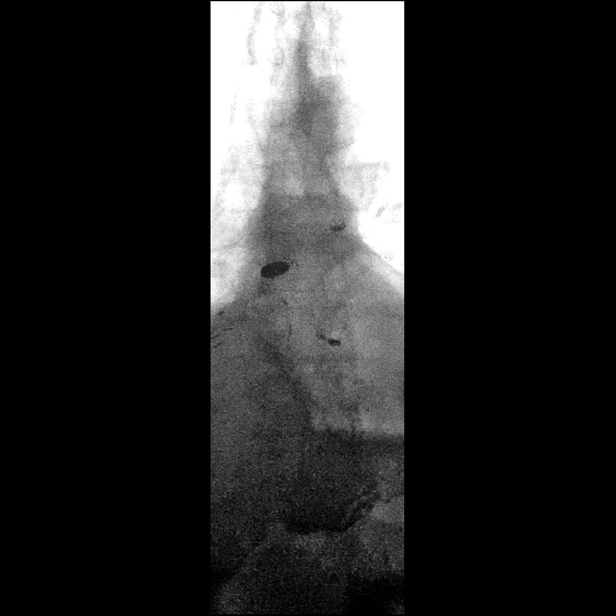

[Series 10: sequence · 1 of 65 frames shown (7 of 8)]
[frame 12/65]
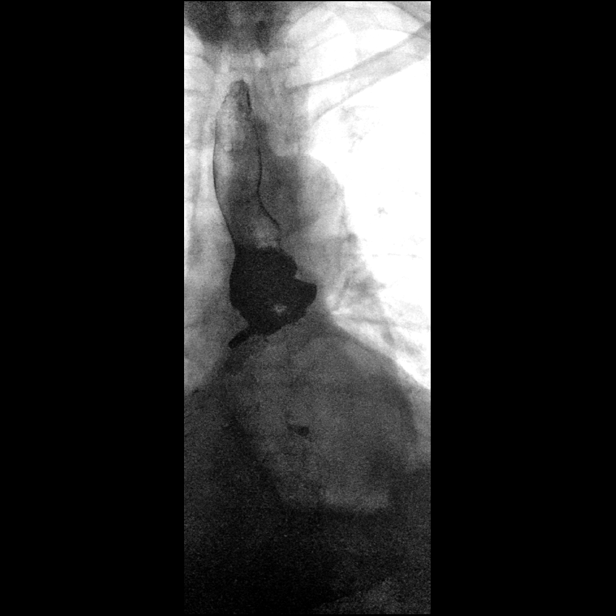

[Series 11: sequence · 1 of 52 frames shown (8 of 8)]
[frame 11/52]
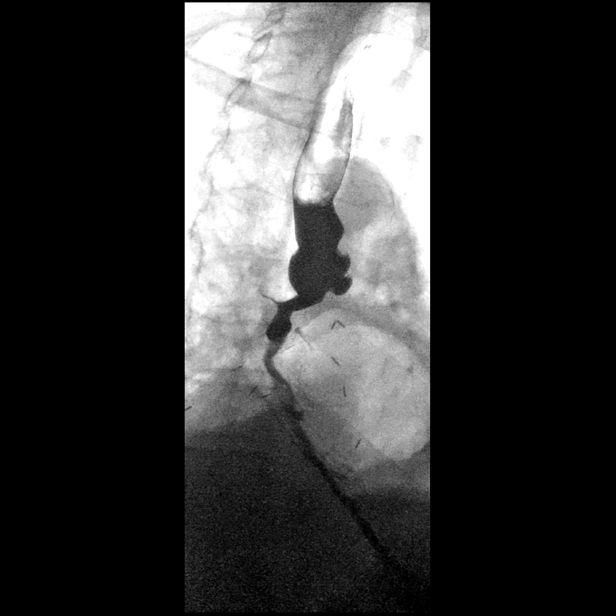

[Series 12: one shot · 0.14mm/px · 1 of 1 slices shown (2 of 2)]
[im 1/1]
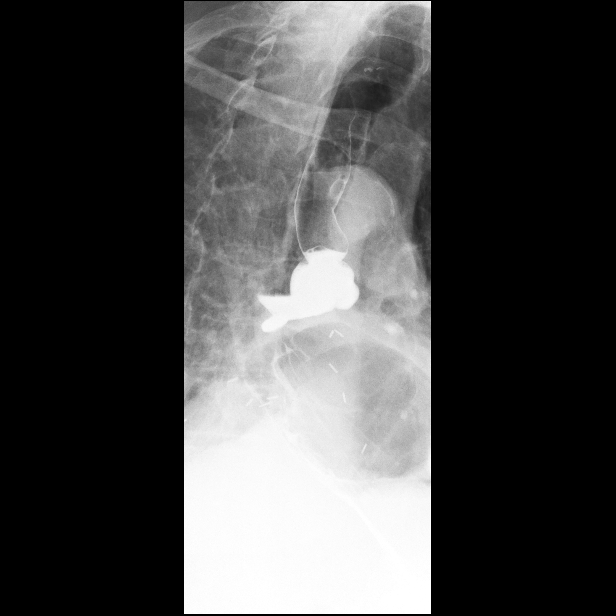

[14 of 24 positions shown; findings below may reference images not displayed]

FINDINGS: Normal oral and pharyngeal phase of swallowing, with no laryngeal
penetration or tracheobronchial aspiration. No evidence of
pharyngeal mass, stricture or diverticulum. No significant
cricopharyngeus muscle dysfunction.

Mild-to-moderate esophageal dysmotility, characterized by
intermittent weakening of primary peristalsis throughout mid to
lower thoracic esophagus. Moderate to large hiatal hernia. Multiple
surgical clips surround the proximal stomach. There is a moderately
tortuous appearance of the lower thoracic esophagus with multiple
small to moderate size pulsion diverticula in the lower thoracic
esophagus. There is a short segment of smooth moderate
circumferential narrowing in the lower thoracic esophagus extending
to the esophagogastric junction, at which location the barium tablet
became lodged despite multiple water and barium swallows. No
discrete esophageal mass or ulcer. Mild gastroesophageal reflux
elicited to the level of the lower thoracic esophagus with water
siphon test.
IMPRESSION: 1. Moderate to large hiatal hernia. Mild gastroesophageal reflux
elicited.
2. Multiple small to moderate-sized pulsion diverticula in the
tortuous lower thoracic esophagus.
3. Short segment peptic stricture in the lower thoracic esophagus
extending to the esophagogastric junction. No discrete esophageal
mass or ulcer. Upper endoscopic correlation suggested.

## 2023-06-22 DIAGNOSIS — R42 Dizziness and giddiness: Secondary | ICD-10-CM | POA: Diagnosis not present

## 2023-06-22 DIAGNOSIS — R9431 Abnormal electrocardiogram [ECG] [EKG]: Secondary | ICD-10-CM | POA: Diagnosis not present

## 2023-06-22 DIAGNOSIS — R001 Bradycardia, unspecified: Secondary | ICD-10-CM | POA: Diagnosis not present

## 2023-06-24 ENCOUNTER — Ambulatory Visit (HOSPITAL_COMMUNITY)
Admission: RE | Admit: 2023-06-24 | Discharge: 2023-06-24 | Disposition: A | Discharge status: Home / Self Care | Source: Ambulatory Visit | Attending: Cardiology | Admitting: Cardiology

## 2023-06-24 DIAGNOSIS — R0989 Other specified symptoms and signs involving the circulatory and respiratory systems: Secondary | ICD-10-CM | POA: Diagnosis not present

## 2023-07-03 ENCOUNTER — Ambulatory Visit: Payer: Self-pay | Admitting: *Deleted

## 2023-07-04 DIAGNOSIS — H524 Presbyopia: Secondary | ICD-10-CM | POA: Diagnosis not present

## 2023-07-04 DIAGNOSIS — H43813 Vitreous degeneration, bilateral: Secondary | ICD-10-CM | POA: Diagnosis not present

## 2023-07-04 DIAGNOSIS — Z961 Presence of intraocular lens: Secondary | ICD-10-CM | POA: Diagnosis not present

## 2023-07-23 DIAGNOSIS — Z1212 Encounter for screening for malignant neoplasm of rectum: Secondary | ICD-10-CM | POA: Diagnosis not present

## 2023-07-23 DIAGNOSIS — I10 Essential (primary) hypertension: Secondary | ICD-10-CM | POA: Diagnosis not present

## 2023-07-23 DIAGNOSIS — Z125 Encounter for screening for malignant neoplasm of prostate: Secondary | ICD-10-CM | POA: Diagnosis not present

## 2023-07-23 DIAGNOSIS — E785 Hyperlipidemia, unspecified: Secondary | ICD-10-CM | POA: Diagnosis not present

## 2023-07-30 DIAGNOSIS — R55 Syncope and collapse: Secondary | ICD-10-CM | POA: Diagnosis not present

## 2023-07-30 DIAGNOSIS — K222 Esophageal obstruction: Secondary | ICD-10-CM | POA: Diagnosis not present

## 2023-07-30 DIAGNOSIS — Z1331 Encounter for screening for depression: Secondary | ICD-10-CM | POA: Diagnosis not present

## 2023-07-30 DIAGNOSIS — H9193 Unspecified hearing loss, bilateral: Secondary | ICD-10-CM | POA: Diagnosis not present

## 2023-07-30 DIAGNOSIS — Z1339 Encounter for screening examination for other mental health and behavioral disorders: Secondary | ICD-10-CM | POA: Diagnosis not present

## 2023-07-30 DIAGNOSIS — R82998 Other abnormal findings in urine: Secondary | ICD-10-CM | POA: Diagnosis not present

## 2023-07-30 DIAGNOSIS — I10 Essential (primary) hypertension: Secondary | ICD-10-CM | POA: Diagnosis not present

## 2023-07-30 DIAGNOSIS — E785 Hyperlipidemia, unspecified: Secondary | ICD-10-CM | POA: Diagnosis not present

## 2023-07-30 DIAGNOSIS — Z0000 Encounter for general adult medical examination without abnormal findings: Secondary | ICD-10-CM | POA: Diagnosis not present

## 2023-08-06 ENCOUNTER — Other Ambulatory Visit: Payer: Self-pay

## 2023-08-06 ENCOUNTER — Encounter (HOSPITAL_BASED_OUTPATIENT_CLINIC_OR_DEPARTMENT_OTHER): Payer: Self-pay | Admitting: Emergency Medicine

## 2023-08-06 ENCOUNTER — Emergency Department (HOSPITAL_BASED_OUTPATIENT_CLINIC_OR_DEPARTMENT_OTHER)

## 2023-08-06 ENCOUNTER — Emergency Department (HOSPITAL_BASED_OUTPATIENT_CLINIC_OR_DEPARTMENT_OTHER)
Admission: EM | Admit: 2023-08-06 | Discharge: 2023-08-06 | Disposition: A | Discharge status: Home / Self Care | Attending: Emergency Medicine | Admitting: Emergency Medicine

## 2023-08-06 DIAGNOSIS — Z7982 Long term (current) use of aspirin: Secondary | ICD-10-CM | POA: Insufficient documentation

## 2023-08-06 DIAGNOSIS — I6523 Occlusion and stenosis of bilateral carotid arteries: Secondary | ICD-10-CM | POA: Diagnosis not present

## 2023-08-06 DIAGNOSIS — W19XXXA Unspecified fall, initial encounter: Secondary | ICD-10-CM

## 2023-08-06 DIAGNOSIS — Z23 Encounter for immunization: Secondary | ICD-10-CM | POA: Diagnosis not present

## 2023-08-06 DIAGNOSIS — S0990XA Unspecified injury of head, initial encounter: Secondary | ICD-10-CM

## 2023-08-06 DIAGNOSIS — W101XXA Fall (on)(from) sidewalk curb, initial encounter: Secondary | ICD-10-CM | POA: Diagnosis not present

## 2023-08-06 DIAGNOSIS — S022XXA Fracture of nasal bones, initial encounter for closed fracture: Secondary | ICD-10-CM | POA: Diagnosis not present

## 2023-08-06 DIAGNOSIS — Y92480 Sidewalk as the place of occurrence of the external cause: Secondary | ICD-10-CM | POA: Insufficient documentation

## 2023-08-06 MED ORDER — TETANUS-DIPHTH-ACELL PERTUSSIS 5-2.5-18.5 LF-MCG/0.5 IM SUSY
0.5000 mL | PREFILLED_SYRINGE | Freq: Once | INTRAMUSCULAR | Status: AC
  Administered 2023-08-06: 0.5 mL via INTRAMUSCULAR
  Filled 2023-08-06: qty 0.5

## 2023-08-06 MED ORDER — BACITRACIN ZINC 500 UNIT/GM EX OINT: TOPICAL_OINTMENT | Freq: Two times a day (BID) | CUTANEOUS | Status: DC

## 2023-08-06 MED ORDER — LIDOCAINE-EPINEPHRINE-TETRACAINE (LET) TOPICAL GEL
3.0000 mL | Freq: Once | TOPICAL | Status: AC
  Administered 2023-08-06: 3 mL via TOPICAL
  Filled 2023-08-06: qty 3

## 2023-08-06 NOTE — Discharge Instructions (Signed)
 You were seen in the emergency department for evaluation of injuries from a fall.  You had a CAT scan of your head and face which showed bilateral nasal fracture.  Please ice the affected area and can use Tylenol  for pain.  Bacitracin  topical antibiotic.  Gentle soap and water.  Follow-up with your regular doctor.  Return if any worsening or concerning symptoms.

## 2023-08-06 NOTE — ED Triage Notes (Signed)
 Trip fall  Hit nose, face Denies blood thinners No loc Unknown tetanus

## 2023-08-06 NOTE — ED Notes (Signed)
 FALL RISK BUNDLE IS CURRENTLY IN PLACE

## 2023-08-06 NOTE — ED Provider Notes (Signed)
 [place] EMERGENCY DEPARTMENT AT [facility] Provider Note   CSN: [id number] Arrival date & time: 08/06/23  1626     Patient presents with: Fall   Anthony Mclaughlin is a 89 y.o. male.  He is here for evaluation of head and facial injury after trip and fall.  He said he was carrying a box and tripped over a curb.  Landed on his face just prior arrival.  No loss of consciousness.  Some bleeding from his nose.  No other injuries noted.  He is not sure when his last tetanus shot was.  No double vision or blurry vision no numbness or weakness as of breath   The history is provided by the patient and the spouse.  Fall This is a new problem. The current episode started less than 1 hour ago. The problem has not changed since onset.Pertinent negatives include no chest pain, no abdominal pain, no headaches and no shortness of breath. Nothing aggravates the symptoms. Nothing relieves the symptoms. He has tried nothing for the symptoms. The treatment provided no relief.       Prior to Admission medications   Medication Sig Start Date End Date Taking? Authorizing Provider  amLODipine  (NORVASC ) 5 MG tablet Take 1 tablet (5 mg total) by mouth daily. 11/10/22 12/10/22  Scarlette Currier, MD  aspirin EC (ECOTRIN LOW STRENGTH) 81 MG tablet Take 81 mg by mouth daily. 03/06/22   [provider]  metoprolol succinate (TOPROL-XL) 25 MG 24 hr tablet Take 12.5 mg by mouth 2 (two) times daily. 03/06/22   [provider]  Multiple Vitamin (MULTIVITAMIN) tablet Take 1 tablet by mouth daily.    [provider]  rosuvastatin (CRESTOR) 10 MG tablet Take 10 mg by mouth daily. 07/17/22   [provider]    Allergies: Patient has no known allergies.    Review of Systems  HENT:  Positive for nosebleeds.   Respiratory:  Negative for shortness of breath.   Cardiovascular:  Negative for chest pain.  Gastrointestinal:  Negative for abdominal pain.  Musculoskeletal:  Negative for  neck pain.  Skin:  Positive for wound.  Neurological:  Negative for headaches.    Updated Vital Signs BP (!) 163/96 (BP Location: Right Arm)   Pulse 72   Temp 98.6 F (37 C)   Resp 16   SpO2 92%   Physical Exam Vitals and nursing note reviewed.  Constitutional:      General: He is not in acute distress.    Appearance: Normal appearance. He is well-developed.  HENT:     Head: Normocephalic.     Comments: He has significant abrasions across his nose with some swelling and tenderness.  No gross septal hematoma.  No facial tenderness or instability.  No blood in the oropharynx.  Eyes:     Extraocular Movements: Extraocular movements intact.     Conjunctiva/sclera: Conjunctivae normal.     Pupils: Pupils are equal, round, and reactive to light.    Cardiovascular:     Rate and Rhythm: Normal rate and regular rhythm.     Heart sounds: No murmur heard. Pulmonary:     Effort: Pulmonary effort is normal. No respiratory distress.     Breath sounds: Normal breath sounds.  Abdominal:     Palpations: Abdomen is soft.     Tenderness: There is no abdominal tenderness.   Musculoskeletal:        General: No deformity.     Cervical back: Neck supple. No tenderness.  Skin:    General: Skin is warm and dry.     Capillary Refill: Capillary refill takes less than 2 seconds.   Neurological:     General: No focal deficit present.     Mental Status: He is alert and oriented to person, place, and time.     Cranial Nerves: No cranial nerve deficit.   Psychiatric:        Mood and Affect: Mood normal.     (all labs ordered are listed, but only abnormal results are displayed) Labs Reviewed - No data to display  EKG: None  Radiology: CT Head Wo Contrast Result Date: 08/06/2023 CLINICAL DATA:  Head trauma, minor (Age >= 65y); Facial trauma, blunt EXAM: CT HEAD WITHOUT CONTRAST CT MAXILLOFACIAL WITHOUT CONTRAST TECHNIQUE: Multidetector CT imaging of the head and maxillofacial  structures were performed using the standard protocol without intravenous contrast. Multiplanar CT image reconstructions of the maxillofacial structures were also generated. RADIATION DOSE REDUCTION: This exam was performed according to the departmental dose-optimization program which includes automated exposure control, adjustment of the mA and/or kV according to patient size and/or use of iterative reconstruction technique. COMPARISON:  None Available. FINDINGS: CT HEAD FINDINGS Brain: No evidence of large-territorial acute infarction. No parenchymal hemorrhage. No mass lesion. No extra-axial collection. No mass effect or midline shift. No hydrocephalus. Basilar cisterns are patent. Partial empty sella. Vascular: No hyperdense vessel. Atherosclerotic calcifications are present within the cavernous internal carotid arteries. Skull: No acute fracture or focal lesion. Other: None. CT MAXILLOFACIAL FINDINGS Osseous: Bilateral nasal bone acute nondisplaced fracture. No destructive process. Sinuses/Orbits: Paranasal sinuses and mastoid air cells are clear. Bilateral lens replacement. Otherwise the orbits are unremarkable. Soft tissues: Negative. IMPRESSION: 1.  No acute intracranial abnormality. 2. Bilateral nasal bone acute nondisplaced fracture. 3. Partially empty sella. Findings is often a normal anatomic variant but can be associated with idiopathic intracranial hypertension (pseudotumor cerebri). Electronically Signed   By: Morgane  Naveau M.D.   On: 08/06/2023 17:57   CT Maxillofacial WO CM Result Date: 08/06/2023 CLINICAL DATA:  Head trauma, minor (Age >= 65y); Facial trauma, blunt EXAM: CT HEAD WITHOUT CONTRAST CT MAXILLOFACIAL WITHOUT CONTRAST TECHNIQUE: Multidetector CT imaging of the head and maxillofacial structures were performed using the standard protocol without intravenous contrast. Multiplanar CT image reconstructions of the maxillofacial structures were also generated. RADIATION DOSE REDUCTION:  This exam was performed according to the departmental dose-optimization program which includes automated exposure control, adjustment of the mA and/or kV according to patient size and/or use of iterative reconstruction technique. COMPARISON:  None Available. FINDINGS: CT HEAD FINDINGS Brain: No evidence of large-territorial acute infarction. No parenchymal hemorrhage. No mass lesion. No extra-axial collection. No mass effect or midline shift. No hydrocephalus. Basilar cisterns are patent. Partial empty sella. Vascular: No hyperdense vessel. Atherosclerotic calcifications are present within the cavernous internal carotid arteries. Skull: No acute fracture or focal lesion. Other: None. CT MAXILLOFACIAL FINDINGS Osseous: Bilateral nasal bone acute nondisplaced fracture. No destructive process. Sinuses/Orbits: Paranasal sinuses and mastoid air cells are clear. Bilateral lens replacement. Otherwise the orbits are unremarkable. Soft tissues: Negative. IMPRESSION: 1.  No acute intracranial abnormality. 2. Bilateral nasal bone acute nondisplaced fracture. 3. Partially empty sella. Findings is often a normal anatomic variant but can be associated with idiopathic intracranial hypertension (pseudotumor cerebri). Electronically Signed   By: Morgane  Naveau M.D.   On: 08/06/2023 17:57     Procedures   Medications Ordered in the ED  Tdap (BOOSTRIX) injection 0.5 mL (has no administration  in time range)    Clinical Course as of 08/07/23 1024  Tue Aug 06, 2023  1810 CT showing bilateral nasal fractures but otherwise no acute traumatic findings of face or brain.  Reviewed with patient.  They are comfortable plan for discharge and outpatient follow-up.  Return instructions discussed [MB]    Clinical Course User Index [MB] Tonya Fredrickson, MD                                 Medical Decision Making Amount and/or Complexity of Data Reviewed Radiology: ordered.  Risk OTC drugs. Prescription drug  management.   This patient complains of fall facial injury; this involves an extensive number of treatment Options and is a complaint that carries with it a high risk of complications and morbidity. The differential includes fracture, contusion, bleed  I ordered medication tetanus update and reviewed PMP when indicated. I ordered imaging studies which included CT head CT max face and I independently    visualized and interpreted imaging which showed bilateral nasal fracture Additional history obtained from patient's spouse Previous records obtained and reviewed in epic including recent PCP notes Cardiac monitoring reviewed, normal sinus rhythm Social determinants considered, no significant barriers Critical Interventions: None  After the interventions stated above, I reevaluated the patient and found patient to be awake alert neuro intact without any breathing difficulties Admission and further testing considered, no indications for admission or further workup at this time.  Wounds are hemostatic at this time nothing to suture.  Symptomatic management and return instructions discussed      Final diagnoses:  Fall, initial encounter  Injury of head, initial encounter  Closed fracture of nasal bone, initial encounter    ED Discharge Orders     None          Tonya Fredrickson, MD 08/07/23 1025

## 2023-11-22 DIAGNOSIS — Z23 Encounter for immunization: Secondary | ICD-10-CM | POA: Diagnosis not present
